# Patient Record
Sex: Male | Born: 1965 | Race: White | Hispanic: No | Marital: Single | Smoking: Never smoker
Health system: Southern US, Community
[De-identification: ages and names within clinical notes are randomized; demographics above are authoritative.]

## PROBLEM LIST (undated history)

## (undated) DIAGNOSIS — K219 Gastro-esophageal reflux disease without esophagitis: Secondary | ICD-10-CM

## (undated) DIAGNOSIS — E119 Type 2 diabetes mellitus without complications: Secondary | ICD-10-CM

## (undated) HISTORY — PX: NO PAST SURGERIES: SHX2092

## (undated) HISTORY — DX: Gastro-esophageal reflux disease without esophagitis: K21.9

## (undated) HISTORY — DX: Type 2 diabetes mellitus without complications: E11.9

---

## 2000-06-20 ENCOUNTER — Encounter: Admission: RE | Admit: 2000-06-20 | Discharge: 2000-06-20 | Payer: Self-pay | Admitting: Internal Medicine

## 2007-07-16 ENCOUNTER — Ambulatory Visit: Payer: Self-pay | Admitting: Hospitalist

## 2007-07-16 ENCOUNTER — Encounter: Payer: Self-pay | Admitting: Internal Medicine

## 2007-07-16 DIAGNOSIS — R634 Abnormal weight loss: Secondary | ICD-10-CM

## 2007-07-16 DIAGNOSIS — K219 Gastro-esophageal reflux disease without esophagitis: Secondary | ICD-10-CM

## 2007-07-16 LAB — CONVERTED CEMR LAB
AST: 22 units/L (ref 0–37)
Albumin: 4.4 g/dL (ref 3.5–5.2)
BUN: 14 mg/dL (ref 6–23)
Basophils Relative: 0 % (ref 0–1)
Calcium: 9.5 mg/dL (ref 8.4–10.5)
Chloride: 106 meq/L (ref 96–112)
Cholesterol: 128 mg/dL (ref 0–200)
Creatinine, Ser: 0.84 mg/dL (ref 0.40–1.50)
Glucose, Bld: 103 mg/dL — ABNORMAL HIGH (ref 70–99)
HDL: 32 mg/dL — ABNORMAL LOW (ref >39)
Hemoglobin: 16.8 g/dL (ref 13.0–17.0)
Lymphs Abs: 2.5 10*3/uL (ref 0.7–4.0)
MCHC: 34.1 g/dL (ref 30.0–36.0)
MCV: 84 fL (ref 78.0–100.0)
Monocytes Absolute: 1 10*3/uL (ref 0.1–1.0)
Monocytes Relative: 11 % (ref 3–12)
Neutro Abs: 5.3 10*3/uL (ref 1.7–7.7)
Potassium: 3.9 meq/L (ref 3.5–5.3)
RBC: 5.87 M/uL — ABNORMAL HIGH (ref 4.22–5.81)
Total CHOL/HDL Ratio: 4
Triglycerides: 153 mg/dL — ABNORMAL HIGH (ref <150)
WBC: 8.8 10*3/uL (ref 4.0–10.5)

## 2007-08-28 ENCOUNTER — Ambulatory Visit: Payer: Self-pay | Admitting: Internal Medicine

## 2007-08-28 ENCOUNTER — Encounter (INDEPENDENT_AMBULATORY_CARE_PROVIDER_SITE_OTHER): Payer: Self-pay | Admitting: Internal Medicine

## 2007-08-28 DIAGNOSIS — J069 Acute upper respiratory infection, unspecified: Secondary | ICD-10-CM | POA: Insufficient documentation

## 2007-08-29 ENCOUNTER — Encounter: Payer: Self-pay | Admitting: Internal Medicine

## 2007-08-29 ENCOUNTER — Telehealth: Payer: Self-pay | Admitting: Internal Medicine

## 2007-12-25 ENCOUNTER — Encounter: Payer: Self-pay | Admitting: Internal Medicine

## 2008-01-03 ENCOUNTER — Encounter: Payer: Self-pay | Admitting: Internal Medicine

## 2008-03-31 ENCOUNTER — Encounter: Payer: Self-pay | Admitting: Internal Medicine

## 2009-07-06 ENCOUNTER — Encounter: Payer: Self-pay | Admitting: *Deleted

## 2009-07-06 ENCOUNTER — Ambulatory Visit: Payer: Self-pay | Admitting: Internal Medicine

## 2009-07-06 LAB — CONVERTED CEMR LAB
ALT: 28 units/L (ref 0–53)
BUN: 11 mg/dL (ref 6–23)
CO2: 26 meq/L (ref 19–32)
Calcium: 9.3 mg/dL (ref 8.4–10.5)
Chloride: 105 meq/L (ref 96–112)
Creatinine, Ser: 0.86 mg/dL (ref 0.40–1.50)
Glucose, Bld: 81 mg/dL (ref 70–99)
HCT: 50.2 % (ref 39.0–52.0)
HDL: 32 mg/dL — ABNORMAL LOW (ref >39)
MCV: 84.1 fL (ref >=78.0)
Platelets: 164 10*3/uL (ref 150–400)
RBC: 5.97 M/uL — ABNORMAL HIGH (ref 4.22–5.81)
Total Bilirubin: 0.5 mg/dL (ref 0.3–1.2)
Total CHOL/HDL Ratio: 3.4
VLDL: 12 mg/dL (ref 0–40)
WBC: 8.9 10*3/uL (ref 4.0–10.5)

## 2009-07-14 ENCOUNTER — Encounter: Payer: Self-pay | Admitting: Internal Medicine

## 2010-01-07 ENCOUNTER — Encounter: Payer: Self-pay | Admitting: Internal Medicine

## 2010-01-08 ENCOUNTER — Emergency Department (HOSPITAL_COMMUNITY): Admission: EM | Admit: 2010-01-08 | Discharge: 2010-01-08 | Payer: Self-pay | Admitting: Family Medicine

## 2010-03-24 ENCOUNTER — Telehealth: Payer: Self-pay | Admitting: Internal Medicine

## 2010-03-26 ENCOUNTER — Ambulatory Visit: Payer: Self-pay | Admitting: Diagnostic Radiology

## 2010-03-26 ENCOUNTER — Emergency Department (HOSPITAL_BASED_OUTPATIENT_CLINIC_OR_DEPARTMENT_OTHER): Admission: EM | Admit: 2010-03-26 | Discharge: 2010-03-26 | Payer: Self-pay | Admitting: Emergency Medicine

## 2010-03-31 ENCOUNTER — Encounter: Payer: Self-pay | Admitting: Internal Medicine

## 2010-04-13 ENCOUNTER — Encounter: Payer: Self-pay | Admitting: Internal Medicine

## 2010-06-21 ENCOUNTER — Ambulatory Visit
Admission: RE | Admit: 2010-06-21 | Discharge: 2010-06-21 | Payer: Self-pay | Source: Home / Self Care | Attending: Internal Medicine | Admitting: Internal Medicine

## 2010-06-21 LAB — CONVERTED CEMR LAB
AST: 19 units/L (ref 0–37)
Albumin: 4.3 g/dL (ref 3.5–5.2)
Alkaline Phosphatase: 63 units/L (ref 39–117)
Basophils Relative: 0 % (ref 0–1)
Calcium: 9 mg/dL (ref 8.4–10.5)
Chloride: 105 meq/L (ref 96–112)
Eosinophils Absolute: 0 10*3/uL (ref 0.0–0.7)
LDL Cholesterol: 62 mg/dL (ref 0–99)
Lymphs Abs: 2.4 10*3/uL (ref 0.7–4.0)
MCHC: 35.3 g/dL (ref 30.0–36.0)
MCV: 83.3 fL (ref 78.0–100.0)
Neutro Abs: 5.1 10*3/uL (ref 1.7–7.7)
Neutrophils Relative %: 61 % (ref 43–77)
Platelets: 179 10*3/uL (ref 150–400)
Potassium: 4.2 meq/L (ref 3.5–5.3)
RBC: 5.64 M/uL (ref 4.22–5.81)
Sodium: 141 meq/L (ref 135–145)
Total Protein: 6.7 g/dL (ref 6.0–8.3)
WBC: 8.4 10*3/uL (ref 4.0–10.5)

## 2010-06-29 NOTE — Miscellaneous (Signed)
  Clinical Lists Changes  Medications: Added new medication of AZITHROMYCIN 250 MG  TABS (AZITHROMYCIN) 2 by  mouth today and then 1 daily for 4 days - Signed Rx of AZITHROMYCIN 250 MG  TABS (AZITHROMYCIN) 2 by  mouth today and then 1 daily for 4 days;  #6 x 0;  Signed;  Entered by: Julaine Fusi  DO;  Authorized by: Julaine Fusi  DO;  Method used: Electronically to [facility]*, [address], [address] Shipping/mailing, [place], [place]  [address], Ph: [phone], Fax: [phone]    Prescriptions: AZITHROMYCIN 250 MG  TABS (AZITHROMYCIN) 2 by  mouth today and then 1 daily for 4 days  #6 x 0   Entered and Authorized by:   Julaine Fusi  DO   Signed by:   Julaine Fusi  DO on 04/13/2010   Method used:   Electronically to        [facility]* (retail)       [address]       [address] Shipping/mailing       [place], [place]  [address]       Ph: [phone]       Fax: [phone]   RxID:   [id number]

## 2010-06-29 NOTE — Letter (Signed)
Summary: Work Excuse  [facility]  [address]   [place], [place] [address]   Phone: [phone]  Fax: [phone]    Today's Date: January 07, 2010  Name of Patient: Derek Davidson  The above named patient had a medical visit today at:  am / pm.  Please take this into consideration when reviewing the time away from work/school.    Special Instructions:  [  ] None  [  ] To be off the remainder of today, returning to the normal work / school schedule tomorrow.  [  ] To be off until the next scheduled appointment on ______________________.  [  X] Other ___Off work 8/10-8/12_______________________________ ________________________________________________________________________   Sincerely yours,   Julaine Fusi  DO

## 2010-06-29 NOTE — Progress Notes (Signed)
Summary: refill/gg  Phone Note Refill Request  on March 24, 2010 3:10 PM  Refills Requested: Medication #1:  OMEPRAZOLE 40 MG  CPDR Take 1 tablet by mouth once a day. Pt called and asked for Rx for nexium 20 mg, it works better for him.  He states he talked to you about the change   Method Requested: Electronic Initial call taken by: Merrie Roof RN,  March 24, 2010 3:12 PM Initial call taken by: Merrie Roof RN,  March 24, 2010 3:10 PM  Follow-up for Phone Call        If I am looking through records correctly, he has not been seen since 07/2007.  Will not refill if that is the case. Follow-up by: Mariea Stable MD,  March 26, 2010 12:14 PM  Additional Follow-up for Phone Call Additional follow up Details #1::        Flag sent to [facility]  to schedule appointment. Additional Follow-up by: Merrie Roof RN,  March 31, 2010 2:51 PM

## 2010-06-29 NOTE — Letter (Signed)
Summary: Out of Work  [facility]  [address]   [place], [place] [address]   Phone: [phone]  Fax: [phone]    July 14, 2009   Employee:  RAYLIN WINER    To Whom It May Concern:   For Medical reasons, please excuse the above named employee from work for the following dates:  Start:    End:    If you need additional information, please feel free to contact our office.         Sincerely,    Julaine Fusi  DO

## 2010-06-29 NOTE — Letter (Signed)
Summary: Out of Work  [facility]  [address]   [place], [place] [address]   Phone: [phone]  Fax: [phone]    July 14, 2009   Employee:  TINSLEY LOMAS    To Whom It May Concern:   For Medical reasons, please excuse the above named employee from work for the following dates:  Start:   07/14/2009  End:   07/15/2009  If you need additional information, please feel free to contact our office.         Sincerely,    Edsel Petrin, DO

## 2010-06-29 NOTE — Miscellaneous (Signed)
  Clinical Lists Changes  Medications: Removed medication of ACIPHEX 20 MG  TBEC (RABEPRAZOLE SODIUM) Take 1 tablet by mouth once a day as needed Changed medication from OMEPRAZOLE 40 MG  CPDR (OMEPRAZOLE) Take 1 tablet by mouth once a day to NEXIUM 40 MG PACK (ESOMEPRAZOLE MAGNESIUM) Take 1 tablet by mouth once a day - Signed Rx of NEXIUM 40 MG PACK (ESOMEPRAZOLE MAGNESIUM) Take 1 tablet by mouth once a day;  #90 x 3;  Signed;  Entered by: Julaine Fusi  DO;  Authorized by: Julaine Fusi  DO;  Method used: Electronically to [facility]*, [address], [address] Shipping/mailing, [place], [place]  [address], Ph: [phone], Fax: [phone]    Prescriptions: NEXIUM 40 MG PACK (ESOMEPRAZOLE MAGNESIUM) Take 1 tablet by mouth once a day  #90 x 3   Entered and Authorized by:   Julaine Fusi  DO   Signed by:   Julaine Fusi  DO on 03/31/2010   Method used:   Electronically to        [facility]* (retail)       [address]       [address] Shipping/mailing       [place], [place]  [address]       Ph: [phone]       Fax: [phone]   RxID:   [id number]

## 2010-07-07 NOTE — Assessment & Plan Note (Signed)
Summary: TO SEE DR Phillips Odor at 1:15pm/cfb   Vital Signs:  Patient profile:   45 year old male Height:      71 inches (180.34 cm) Weight:      216.7 pounds (102.73 kg) BMI:     31.63 Temp:     97.7 degrees F (36.50 degrees C) oral Pulse rate:   79 / minute BP sitting:   124 / 75  (right arm) Cuff size:   regular  Vitals Entered By: Theotis Barrio NT II (June 21, 2010 1:25 PM) CC: PATIENT STATES HE IS HERE FOR PHYSICAL  / , Preventive Care Is Patient Diabetic? No Pain Assessment Patient in pain? no       Have you ever been in a relationship where you felt threatened, hurt or afraid?No   Does patient need assistance? Functional Status Self care Ambulation Normal   CC:  PATIENT STATES HE IS HERE FOR PHYSICAL  /  and Preventive Care.  History of Present Illness: Derek Davidson is in for a regular check up. No complaints or problems. Needs med refills on his reflux medicine. Has been on a weight loss plan doing well.  Preventive Screening-Counseling & Management  Alcohol-Tobacco     Smoking Status: never  Allergies: 1)  ! Hydrocodone  Physical Exam  General:  alert.   Lungs:  normal breath sounds, no crackles, and no wheezes.   Heart:  normal rate, regular rhythm, no murmur, no gallop, and no rub.   Abdomen:  soft, non-tender, normal bowel sounds, no distention, and no masses.   Msk:  No deformity or scoliosis noted of thoracic or lumbar spine.   Extremities:  no edema Neurologic:  alert & oriented X3.   Skin:  Intact without suspicious lesions or rashes Psych:  Cognition and judgment appear intact. Alert and cooperative with normal attention span and concentration. No apparent delusions, illusions, hallucinations   Impression & Recommendations:  Problem # 1:  PREVENTIVE HEALTH CARE (ICD-V70.0) Due for lipid panel today. Orders: T-Lipid Profile ([id number])  Problem # 2:  GERD (ICD-530.81) Refilled his Nexium. Has never had endoscopy. Will get colonoscopy and  consider endoscopy for long term GERD when he turns 49. Otehrwise no change. His updated medication list for this problem includes:    Nexium 40 Mg Pack (Esomeprazole magnesium) .Marland Kitchen... Take 1 tablet by mouth once a day  Orders: T-Comprehensive Metabolic Panel ([id number]) T-CBC w/Diff ([id number])  Complete Medication List: 1)  Nexium 40 Mg Pack (Esomeprazole magnesium) .... Take 1 tablet by mouth once a day  Patient Instructions: 1)  Please schedule a follow-up appointment as needed.   Orders Added: 1)  Est. Patient Level III [52841] 2)  T-Comprehensive Metabolic Panel [[id number]] 3)  T-Lipid Profile [[id number]] 4)  T-CBC w/Diff [[id number]]   Process Orders Check Orders Results:     [facility]: ABN not required for this insurance Tests Sent for requisitioning (June 28, 2010 2:05 PM):     06/21/2010: [facility] -- T-Comprehensive Metabolic Panel [[id number]] (signed)     06/21/2010: [facility] -- T-Lipid Profile [phone] (signed)     06/21/2010: [facility] -- T-CBC w/Diff [[id number]] (signed)     Prevention & Chronic Care Immunizations   Influenza vaccine: Not documented    Tetanus booster: Not documented    Pneumococcal vaccine: Not documented  Other Screening   Smoking status: never  (06/21/2010)  Lipids   Total Cholesterol: 110  (07/06/2009)   LDL: 66  (07/06/2009)   LDL Direct:  Not documented   HDL: 32  (07/06/2009)   Triglycerides: 62  (07/06/2009)    Process Orders Check Orders Results:     [facility]: ABN not required for this insurance Tests Sent for requisitioning (June 28, 2010 2:05 PM):     06/21/2010: [facility] -- T-Comprehensive Metabolic Panel [[id number]] (signed)     06/21/2010: [facility] -- T-Lipid Profile [phone] (signed)     06/21/2010: [facility] -- [facility] w/Diff  [[id number]] (signed)

## 2010-08-13 LAB — POCT I-STAT, CHEM 8
BUN: 8 mg/dL (ref 6–23)
Chloride: 102 mEq/L (ref 96–112)
Creatinine, Ser: 1 mg/dL (ref 0.4–1.5)
Hemoglobin: 17 g/dL (ref 13.0–17.0)
Potassium: 3.6 mEq/L (ref 3.5–5.1)
Sodium: 139 mEq/L (ref 135–145)

## 2010-08-13 LAB — POCT URINALYSIS DIPSTICK
Glucose, UA: NEGATIVE mg/dL
Ketones, ur: NEGATIVE mg/dL
Specific Gravity, Urine: 1.025 (ref 1.005–1.030)

## 2010-10-26 ENCOUNTER — Encounter: Payer: Self-pay | Admitting: Internal Medicine

## 2010-12-10 ENCOUNTER — Encounter: Payer: Self-pay | Admitting: Internal Medicine

## 2011-05-10 ENCOUNTER — Other Ambulatory Visit: Payer: Self-pay | Admitting: *Deleted

## 2011-05-10 NOTE — Telephone Encounter (Signed)
Pt last seen 1/12 He will call and make appointment for routine physical.

## 2011-05-11 MED ORDER — ESOMEPRAZOLE MAGNESIUM 40 MG PO PACK: 40.0000 mg | PACK | Freq: Every day | ORAL | Status: DC

## 2012-01-27 ENCOUNTER — Encounter: Payer: Self-pay | Admitting: Internal Medicine

## 2012-01-27 ENCOUNTER — Ambulatory Visit (INDEPENDENT_AMBULATORY_CARE_PROVIDER_SITE_OTHER): Payer: BC Managed Care – PPO | Admitting: Internal Medicine

## 2012-01-27 VITALS — BP 120/80 | HR 97 | Temp 97.2°F | Ht 70.0 in | Wt 226.1 lb

## 2012-01-27 DIAGNOSIS — K219 Gastro-esophageal reflux disease without esophagitis: Secondary | ICD-10-CM

## 2012-01-27 DIAGNOSIS — Z136 Encounter for screening for cardiovascular disorders: Secondary | ICD-10-CM

## 2012-01-27 DIAGNOSIS — L918 Other hypertrophic disorders of the skin: Secondary | ICD-10-CM

## 2012-01-27 DIAGNOSIS — L919 Hypertrophic disorder of the skin, unspecified: Secondary | ICD-10-CM

## 2012-01-27 LAB — COMPREHENSIVE METABOLIC PANEL
ALT: 65 U/L — ABNORMAL HIGH (ref 0–53)
CO2: 25 mEq/L (ref 19–32)
Creat: 0.98 mg/dL (ref 0.50–1.35)
Total Bilirubin: 0.8 mg/dL (ref 0.3–1.2)

## 2012-01-27 LAB — LIPID PANEL
HDL: 33 mg/dL — ABNORMAL LOW (ref >39)
LDL Cholesterol: 48 mg/dL (ref 0–99)
Total CHOL/HDL Ratio: 3.1 Ratio
Triglycerides: 109 mg/dL (ref <150)

## 2012-01-27 MED ORDER — ESOMEPRAZOLE MAGNESIUM 40 MG PO CPDR: 40.0000 mg | DELAYED_RELEASE_CAPSULE | Freq: Every day | ORAL | Status: DC
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
## 2012-01-27 NOTE — Patient Instructions (Signed)
1.  Hold off putting deodorant on the armpits for 2 days to let the area heal  2. Shower with the band aid on then change them daily after the shower.  3.  Start an Aspirin 81 mg.  1 tablet daily for your heart.  4.  Stop in the lab to have your blood drawn.  If we need to discuss the results I will call you.  5. Follow up in 6-12 months.

## 2012-02-13 ENCOUNTER — Other Ambulatory Visit: Payer: Self-pay | Admitting: *Deleted

## 2012-02-13 DIAGNOSIS — K219 Gastro-esophageal reflux disease without esophagitis: Secondary | ICD-10-CM

## 2012-02-14 MED ORDER — ESOMEPRAZOLE MAGNESIUM 40 MG PO CPDR: 40.0000 mg | DELAYED_RELEASE_CAPSULE | Freq: Every day | ORAL | Status: DC

## 2012-02-14 NOTE — Telephone Encounter (Signed)
I filled this for 30 capsules with one refill at his visit on 8/30 which should be for a 30 day supply.  Can you call and see why he is asking for a refill already?

## 2012-02-14 NOTE — Telephone Encounter (Signed)
i have called and he has a new pharm now, old pharm states he didn't have any refills left, that there was only 1 on the last one

## 2012-03-22 DIAGNOSIS — L918 Other hypertrophic disorders of the skin: Secondary | ICD-10-CM | POA: Insufficient documentation

## 2012-03-25 NOTE — Assessment & Plan Note (Signed)
Patient has a history of GERD symptoms and uses Nexium every few days.  We will refill the medication for him today.

## 2012-03-25 NOTE — Assessment & Plan Note (Addendum)
Lab Results  Component Value Date   CHOL 103 01/27/2012   CHOL 108 06/21/2010   CHOL 110 07/06/2009   Lab Results  Component Value Date   HDL 33* 01/27/2012   HDL 34* 06/21/2010   HDL 32* 07/06/2009   Lab Results  Component Value Date   LDLCALC 48 01/27/2012   LDLCALC 62 06/21/2010   LDLCALC 66 07/06/2009   Lab Results  Component Value Date   TRIG 109 01/27/2012   TRIG 59 06/21/2010   TRIG 62 07/06/2009   Lab Results  Component Value Date   CHOLHDL 3.1 01/27/2012   CHOLHDL 3.2 Ratio 06/21/2010   CHOLHDL 3.4 Ratio 07/06/2009   No results found for this basename: LDLDIRECT   Comprehensive Metabolic Panel:    Component Value Date/Time   NA 142 01/27/2012 1632   K 4.2 01/27/2012 1632   CL 107 01/27/2012 1632   CO2 25 01/27/2012 1632   BUN 13 01/27/2012 1632   CREATININE 0.98 01/27/2012 1632   CREATININE 0.87 06/21/2010 2030   GLUCOSE 87 01/27/2012 1632   CALCIUM 9.2 01/27/2012 1632   AST 35 01/27/2012 1632   ALT 65* 01/27/2012 1632   ALKPHOS 83 01/27/2012 1632   BILITOT 0.8 01/27/2012 1632   PROT 7.0 01/27/2012 1632   ALBUMIN 4.7 01/27/2012 1632   LDL goal of <130 with his risk factors and he is well below that.  We will continue to monitor every 3-5 years.  His fasting glucose today is 87 so he does not have diabetes.  There is a mild increase in ALT that is isolated.  We will continue to monitor.  We will have him start a daily ASA for primary prevention.

## 2012-03-25 NOTE — Assessment & Plan Note (Signed)
Derek Davidson has several skin tags in his bilateral axilla.  There are only 3 that have sub 1 mm bases.  He would like them removed because they become irritated when he moves his arms.    Risks and benefits were discussed including bleeding, infection, and pain.  Benefits including not irritating and decreased pain.  Patient agreed to proceed.  Consent was signed.    3 skin tags, two left and 1 right, with sub 1 mm bases removed with sterile scissors after iodine scrub.   Minimal blood loss was stopped with silver nitrate sticks.  Patient tolerated the procedure well.

## 2014-01-02 ENCOUNTER — Encounter (HOSPITAL_COMMUNITY): Payer: Self-pay | Admitting: Emergency Medicine

## 2014-01-02 ENCOUNTER — Emergency Department (HOSPITAL_COMMUNITY)
Admission: EM | Admit: 2014-01-02 | Discharge: 2014-01-02 | Disposition: A | Discharge status: Home / Self Care | Payer: BC Managed Care – PPO | Source: Home / Self Care | Attending: Family Medicine | Admitting: Family Medicine

## 2014-01-02 DIAGNOSIS — A09 Infectious gastroenteritis and colitis, unspecified: Secondary | ICD-10-CM

## 2014-01-02 MED ORDER — CIPROFLOXACIN HCL 500 MG PO TABS: 500.0000 mg | ORAL_TABLET | Freq: Two times a day (BID) | ORAL | Status: DC

## 2014-01-02 NOTE — ED Notes (Signed)
Reports abdominal cramping with nausea.  On set yesterday evening.  Took otc alka-seltzer stomach med with mild relief.  Work this a.m with diarrhea and more intense abdominal cramping.  Denies fever and vomiting.
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
## 2014-01-02 NOTE — Discharge Instructions (Signed)
Thank you for coming in today. Take Cipro twice daily for 3 days.  Use Imodium as needed. Do not use if you have severe pain or blood in the stool Come back as needed If your belly pain worsens, or you have high fever, bad vomiting, blood in your stool or black tarry stool go to the Emergency Room.    Diarrhea Diarrhea is frequent loose and watery bowel movements. It can cause you to feel weak and dehydrated. Dehydration can cause you to become tired and thirsty, have a dry mouth, and have decreased urination that often is dark yellow. Diarrhea is a sign of another problem, most often an infection that will not last long. In most cases, diarrhea typically lasts 2-3 days. However, it can last longer if it is a sign of something more serious. It is important to treat your diarrhea as directed by your caregiver to lessen or prevent future episodes of diarrhea. CAUSES  Some common causes include:  Gastrointestinal infections caused by viruses, bacteria, or parasites.  Food poisoning or food allergies.  Certain medicines, such as antibiotics, chemotherapy, and laxatives.  Artificial sweeteners and fructose.  Digestive disorders. HOME CARE INSTRUCTIONS  Ensure adequate fluid intake (hydration): Have 1 cup (8 oz) of fluid for each diarrhea episode. Avoid fluids that contain simple sugars or sports drinks, fruit juices, whole milk products, and sodas. Your urine should be clear or pale yellow if you are drinking enough fluids. Hydrate with an oral rehydration solution that you can purchase at pharmacies, retail stores, and online. You can prepare an oral rehydration solution at home by mixing the following ingredients together:   - tsp table salt.   tsp baking soda.   tsp salt substitute containing potassium chloride.  1  tablespoons sugar.  1 L (34 oz) of water.  Certain foods and beverages may increase the speed at which food moves through the gastrointestinal (GI) tract. These foods  and beverages should be avoided and include:  Caffeinated and alcoholic beverages.  High-fiber foods, such as raw fruits and vegetables, nuts, seeds, and whole grain breads and cereals.  Foods and beverages sweetened with sugar alcohols, such as xylitol, sorbitol, and mannitol.  Some foods may be well tolerated and may help thicken stool including:  Starchy foods, such as rice, toast, pasta, low-sugar cereal, oatmeal, grits, baked potatoes, crackers, and bagels.  Bananas.  Applesauce.  Add probiotic-rich foods to help increase healthy bacteria in the GI tract, such as yogurt and fermented milk products.  Wash your hands well after each diarrhea episode.  Only take over-the-counter or prescription medicines as directed by your caregiver.  Take a warm bath to relieve any burning or pain from frequent diarrhea episodes. SEEK IMMEDIATE MEDICAL CARE IF:   You are unable to keep fluids down.  You have persistent vomiting.  You have blood in your stool, or your stools are black and tarry.  You do not urinate in 6-8 hours, or there is only a small amount of very dark urine.  You have abdominal pain that increases or localizes.  You have weakness, dizziness, confusion, or light-headedness.  You have a severe headache.  Your diarrhea gets worse or does not get better.  You have a fever or persistent symptoms for more than 2-3 days.  You have a fever and your symptoms suddenly get worse. MAKE SURE YOU:   Understand these instructions.  Will watch your condition.  Will get help right away if you are not doing well or  get worse. Document Released: 05/06/2002 Document Revised: 09/30/2013 Document Reviewed: 01/22/2012 [facility] Patient Information 2015 [place], [place]. This information is not intended to replace advice given to you by your health care provider. Make sure you discuss any questions you have with your health care provider.
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
## 2016-05-03 ENCOUNTER — Telehealth: Payer: Self-pay | Admitting: Internal Medicine

## 2016-05-03 NOTE — Telephone Encounter (Signed)
APT. REMINDER CALL, LMTCB °

## 2016-05-04 ENCOUNTER — Encounter (INDEPENDENT_AMBULATORY_CARE_PROVIDER_SITE_OTHER): Payer: Self-pay

## 2016-05-04 ENCOUNTER — Encounter: Payer: Self-pay | Admitting: Internal Medicine

## 2016-05-04 ENCOUNTER — Ambulatory Visit (INDEPENDENT_AMBULATORY_CARE_PROVIDER_SITE_OTHER): Payer: No Typology Code available for payment source | Admitting: Internal Medicine

## 2016-05-04 VITALS — BP 128/95 | HR 94 | Temp 97.6°F | Ht 69.0 in | Wt 197.6 lb

## 2016-05-04 DIAGNOSIS — K219 Gastro-esophageal reflux disease without esophagitis: Secondary | ICD-10-CM

## 2016-05-04 DIAGNOSIS — Z0000 Encounter for general adult medical examination without abnormal findings: Secondary | ICD-10-CM

## 2016-05-04 DIAGNOSIS — Z79899 Other long term (current) drug therapy: Secondary | ICD-10-CM

## 2016-05-04 DIAGNOSIS — Z833 Family history of diabetes mellitus: Secondary | ICD-10-CM | POA: Diagnosis not present

## 2016-05-04 DIAGNOSIS — R634 Abnormal weight loss: Secondary | ICD-10-CM

## 2016-05-04 DIAGNOSIS — Z800 Family history of malignant neoplasm of digestive organs: Secondary | ICD-10-CM

## 2016-05-04 LAB — GLUCOSE, CAPILLARY: GLUCOSE-CAPILLARY: 332 mg/dL — AB (ref 65–99)

## 2016-05-04 LAB — POCT GLYCOSYLATED HEMOGLOBIN (HGB A1C): Hemoglobin A1C: >14

## 2016-05-04 MED ORDER — METFORMIN HCL 500 MG PO TABS: ORAL_TABLET | ORAL | 1 refills | Status: DC

## 2016-05-04 NOTE — Patient Instructions (Signed)
[name],  It was a pleasure to meet you today. I am going to check some blood work for you today. The results will come back in the next 1-2 days. I will give you a call with the results.  I would also like to get you set up to be seen by the stomach doctors who do colonoscopies to have that done. They will call you for an appointment.  I would like to see you back in 3 months for follow up or sooner if you have any problems.

## 2016-05-05 ENCOUNTER — Encounter: Payer: Self-pay | Admitting: Gastroenterology

## 2016-05-05 LAB — CMP14 + ANION GAP
ALBUMIN: 4.3 g/dL (ref 3.5–5.5)
ALK PHOS: 115 IU/L (ref 39–117)
ALT: 22 IU/L (ref 0–44)
AST: 17 IU/L (ref 0–40)
Albumin/Globulin Ratio: 1.7 (ref 1.2–2.2)
Anion Gap: 20 mmol/L — ABNORMAL HIGH (ref 10.0–18.0)
BILIRUBIN TOTAL: 0.5 mg/dL (ref 0.0–1.2)
BUN / CREAT RATIO: 15 (ref 9–20)
BUN: 12 mg/dL (ref 6–24)
CHLORIDE: 93 mmol/L — AB (ref 96–106)
CO2: 24 mmol/L (ref 18–29)
Calcium: 8.9 mg/dL (ref 8.7–10.2)
Creatinine, Ser: 0.78 mg/dL (ref 0.76–1.27)
GFR calc Af Amer: 121 mL/min/{1.73_m2} (ref >59)
GFR calc non Af Amer: 105 mL/min/{1.73_m2} (ref >59)
GLOBULIN, TOTAL: 2.5 g/dL (ref 1.5–4.5)
Glucose: 343 mg/dL — ABNORMAL HIGH (ref 65–99)
Potassium: 4 mmol/L (ref 3.5–5.2)
SODIUM: 137 mmol/L (ref 134–144)
Total Protein: 6.8 g/dL (ref 6.0–8.5)

## 2016-05-05 LAB — CBC WITH DIFFERENTIAL/PLATELET
BASOS ABS: 0 10*3/uL (ref 0.0–0.2)
Basos: 0 %
EOS (ABSOLUTE): 0 10*3/uL (ref 0.0–0.4)
Eos: 0 %
Hematocrit: 46.7 % (ref 37.5–51.0)
Hemoglobin: 16.6 g/dL (ref 13.0–17.7)
Immature Grans (Abs): 0 10*3/uL (ref 0.0–0.1)
Immature Granulocytes: 0 %
LYMPHS ABS: 2.9 10*3/uL (ref 0.7–3.1)
Lymphs: 31 %
MCH: 29.2 pg (ref 26.6–33.0)
MCHC: 35.5 g/dL (ref 31.5–35.7)
MCV: 82 fL (ref 79–97)
MONOS ABS: 0.9 10*3/uL (ref 0.1–0.9)
Monocytes: 9 %
NEUTROS ABS: 5.7 10*3/uL (ref 1.4–7.0)
Neutrophils: 60 %
PLATELETS: 186 10*3/uL (ref 150–379)
RBC: 5.68 x10E6/uL (ref 4.14–5.80)
RDW: 13.1 % (ref 12.3–15.4)
WBC: 9.5 10*3/uL (ref 3.4–10.8)

## 2016-05-05 LAB — TSH: TSH: 2.97 u[IU]/mL (ref 0.450–4.500)

## 2016-05-06 DIAGNOSIS — R634 Abnormal weight loss: Secondary | ICD-10-CM | POA: Insufficient documentation

## 2016-05-06 DIAGNOSIS — Z0000 Encounter for general adult medical examination without abnormal findings: Secondary | ICD-10-CM | POA: Insufficient documentation

## 2016-05-06 MED ORDER — INSULIN DETEMIR 100 UNIT/ML ~~LOC~~ SOLN: 0.1000 [IU]/kg | Freq: Every day | SUBCUTANEOUS | 1 refills | Status: DC

## 2016-05-06 MED ORDER — GLUCOSE BLOOD VI STRP: ORAL_STRIP | 12 refills | Status: DC

## 2016-05-06 MED ORDER — ASSURE PRO BLOOD GLUCOSE METER DEVI: 1.0000 "application " | Freq: Four times a day (QID) | 0 refills | Status: DC

## 2016-05-06 NOTE — Progress Notes (Signed)
Medicine attending: Medical history, presenting problems, physical findings, and medications, reviewed with resident physician Dr Nathan Boswell on the day of the patient visit and I concur with his evaluation and management plan. 

## 2016-05-06 NOTE — Assessment & Plan Note (Signed)
Due for colonoscopy. Referral made.  Needs flu vaccine at next visit.

## 2016-05-06 NOTE — Assessment & Plan Note (Signed)
Takes Nexium. Takes 1 tablet a week. No other complaints.   Continue current medications.

## 2016-05-06 NOTE — Assessment & Plan Note (Addendum)
In the last 8 months, has lost approximately 30 lbs. Unintentional. Has noticed losing muscle mass. No diet change. Reports no change in appetite. Reports polyuria and polydipsia. Does note that he has cut soft drinks out of his diet. No cough, no night sweats, no fevers/chills. Having some abdominal pain and diarrhea 1-2 times a month. Says this occurs whenever he visits his sister in [place], [place] and drinking the water or ice there. No nausea/vomiting. No fatigue. No hematuria. Normal bowel movements. Was previously going twice a day and now only going once a day. No melena or hematochezia. No pencil thin stools.  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 Never smoker. Does not drink ETOH. No drug use.  Only family history of CA was colon cancer in his maternal grandmother at age 51-70. Does have a family history of diabetes type 2 in his father and his sister is pre-diabetic.  Reports having a colonoscopy 3 years ago here in town.  Weight was 226 12/2010 visit. Weight is 197 lbs here today.   Plan Check A1c, CMET, CBC and TSH. Will refer for colonoscopy as cannot find records of his previous. Concerns for diabetes vs malignancy but nothing focal on exam to point to any particular etiology.  ADDENEDUM 05/05/16 1725: A1c returned > 14 with CBG 332. BMET pending. Attempted to call patient but went to voicemail. Will attempt to contact patient again. CBC, TSH wnl.   05/06/16: Attempt to call patient. BMET returned with AG of 20 (primarily due to low Chloride). He reported good PO intake with no N/V/D at our visit. Would like patient to return for further evaluation.   05/07/16: Able to contact the patient this morning and go over his results. Sent in Rx for Metformin and Levemir as well as testing supplies. Discussed the dosing and checking his blood sugars. Marland Kitchen. He reports he is tolerating PO intake and has no complaints. Do not suspect he is in DKA that would require hospitalization. Will arrange close  follow up next week. Check UA and microalbumin at that visit.

## 2016-05-31 ENCOUNTER — Other Ambulatory Visit: Payer: Self-pay | Admitting: Pharmacist

## 2016-05-31 DIAGNOSIS — Z136 Encounter for screening for cardiovascular disorders: Secondary | ICD-10-CM

## 2016-05-31 MED ORDER — INSULIN GLARGINE 100 UNIT/ML SOLOSTAR PEN: 9.0000 [IU] | PEN_INJECTOR | Freq: Every day | SUBCUTANEOUS | 11 refills | Status: DC

## 2016-06-01 NOTE — Progress Notes (Unsigned)
Patient states levemir is too costly. Derek Davidson researched patient formulary and found the following: [facility] value formulary- levemir, basiliglar, novolog, novolog mix 70/30, OTC novolin R, N, 70/30  Contacted pharmacy to inquire about insulin price for patient (levemir on formulary but costly). Pharmacy was closed. Will try back later today.

## 2016-06-02 ENCOUNTER — Telehealth: Payer: Self-pay | Admitting: Pharmacist

## 2016-06-02 DIAGNOSIS — Z136 Encounter for screening for cardiovascular disorders: Secondary | ICD-10-CM

## 2016-06-02 MED ORDER — INSULIN GLARGINE 100 UNITS/ML SOLOSTAR PEN: 9.0000 [IU] | PEN_INJECTOR | Freq: Every day | SUBCUTANEOUS | 11 refills | Status: DC

## 2016-06-02 MED ORDER — INSULIN PEN NEEDLE 31G X 5 MM MISC: 1.0000 "application " | Freq: Every day | 11 refills | Status: DC
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
## 2016-06-10 LAB — HM DIABETES EYE EXAM

## 2016-06-15 ENCOUNTER — Encounter: Payer: No Typology Code available for payment source | Admitting: Dietician

## 2016-06-17 ENCOUNTER — Ambulatory Visit (INDEPENDENT_AMBULATORY_CARE_PROVIDER_SITE_OTHER): Payer: No Typology Code available for payment source | Admitting: Dietician

## 2016-06-17 ENCOUNTER — Ambulatory Visit (INDEPENDENT_AMBULATORY_CARE_PROVIDER_SITE_OTHER): Payer: No Typology Code available for payment source | Admitting: Internal Medicine

## 2016-06-17 ENCOUNTER — Encounter: Payer: Self-pay | Admitting: Internal Medicine

## 2016-06-17 ENCOUNTER — Other Ambulatory Visit: Payer: Self-pay | Admitting: Dietician

## 2016-06-17 DIAGNOSIS — E118 Type 2 diabetes mellitus with unspecified complications: Secondary | ICD-10-CM

## 2016-06-17 DIAGNOSIS — E119 Type 2 diabetes mellitus without complications: Secondary | ICD-10-CM

## 2016-06-17 DIAGNOSIS — Z713 Dietary counseling and surveillance: Secondary | ICD-10-CM

## 2016-06-17 DIAGNOSIS — Z0000 Encounter for general adult medical examination without abnormal findings: Secondary | ICD-10-CM

## 2016-06-17 LAB — GLUCOSE, CAPILLARY: Glucose-Capillary: 116 mg/dL — ABNORMAL HIGH (ref 65–99)

## 2016-06-17 MED ORDER — GLUCOSE BLOOD VI STRP: ORAL_STRIP | 12 refills | Status: DC

## 2016-06-17 MED ORDER — ONETOUCH VERIO FLEX SYSTEM W/DEVICE KIT: 1.0000 | PACK | Freq: Two times a day (BID) | 0 refills | Status: DC

## 2016-06-17 MED ORDER — ONETOUCH DELICA LANCETS FINE MISC: 12 refills | Status: DC

## 2016-06-17 NOTE — Progress Notes (Signed)
Request a referral for diabetes self management education and support.

## 2016-06-17 NOTE — Progress Notes (Signed)
Diabetes Self-Management Education  Visit Type: (P) First/Initial  Appt. Start Time: 1330 Appt. End Time: 1400  06/17/2016  [name], identified by name and date of birth, is a 51 y.o. male with a diagnosis of Diabetes: (P) Type 2.   ASSESSMENT  There were no vitals taken for this visit. There is no height or weight on file to calculate BMI.      Diabetes Self-Management Education - 06/17/16 1500      Visit Information   Visit Type (P)  First/Initial     Initial Visit   Diabetes Type (P)  Type 2   Are you currently following a meal plan? (P)  Yes   What type of meal plan do you follow? (P)  lower carb   Are you taking your medications as prescribed? (P)  Yes   Date Diagnosed (P)  04/2016     Psychosocial Assessment   Patient Belief/Attitude about Diabetes (P)  Motivated to manage diabetes   Self-care barriers (P)  None   Self-management support (P)  Doctor's office;Family;CDE visits   Patient Concerns (P)  Nutrition/Meal planning;Monitoring   Special Needs (P)  None   Preferred Learning Style (P)  No preference indicated   Learning Readiness (P)  Change in progress   How often do you need to have someone help you when you read instructions, pamphlets, or other written materials from your doctor or pharmacy? (P)  1 - Never     Pre-Education Assessment   Patient understands the diabetes disease and treatment process. (P)  Demonstrates understanding / competency   Patient understands incorporating nutritional management into lifestyle. (P)  Needs Instruction   Patient undertands incorporating physical activity into lifestyle. (P)  Demonstrates understanding / competency   Patient understands using medications safely. (P)  Demonstrates understanding / competency   Patient understands monitoring blood glucose, interpreting and using results (P)  Needs Instruction   Patient understands prevention, detection, and treatment of acute complications. (P)  Demonstrates  understanding / competency   Patient understands prevention, detection, and treatment of chronic complications. (P)  Demonstrates understanding / competency   Patient understands how to develop strategies to address psychosocial issues. (P)  Demonstrates understanding / competency   Patient understands how to develop strategies to promote health/change behavior. (P)  Demonstrates understanding / competency     Complications   Last HgB A1C per patient/outside source (P)  14 %   How often do you check your blood sugar? (P)  0 times/day (not testing)   Fasting Blood glucose range (mg/dL) (P)  [phone]   Number of hypoglycemic episodes per month (P)  --  0   Number of hyperglycemic episodes per week (P)  0   Have you had a dilated eye exam in the past 12 months? (P)  Yes   Have you had a dental exam in the past 12 months? (P)  No   Are you checking your feet? (P)  Yes      Individualized Plan for Diabetes Self-Management Training:   Learning Objective:  Patient will have a greater understanding of diabetes self-management. Patient education plan is to attend individual and/or group sessions per assessed needs and concerns.   My plan to support myself in continuing these changes to care for my diabetes is to attend or contact:   Diabetes Support Groups  Type 2 diabetes support group : 2nd Monday of every month from 6-7 PM at [address] [facility] conference room [phone]  Journals ? Diabetes Forecast- [phone]- DirectoryTags.si ? Diabetes Self-Management- [phone]- www.diabetesselfmanagement.com  Apps ? Calorie Brooke Dare ? Glucose Buddy (Free, tracks blood glucose, graphs)    Let your educator know if you need help accessing the websites.Plan:   There are no Patient Instructions on file for this visit.  Expected Outcomes:     Education material provided: Living Well with Diabetes  If problems or questions, patient to contact team via:   Phone  Future DSME appointment:

## 2016-06-17 NOTE — Assessment & Plan Note (Signed)
Appointment with GI 06/27/16.  Declined Flu shot today. Declined PNA vaccine today.  Will need HIV screening at next blood draw.

## 2016-06-17 NOTE — Progress Notes (Signed)
Medicine attending: Medical history, presenting problems, physical findings, and medications, reviewed with resident physician Dr Valentino NoseNathan Boswell on the day of the patient visit and I concur with his evaluation and management plan. Newly dxd type 2 diabetic. Hb A1c 14%. Started on metformin and levimir. Rapid control of sugars. We anticipate he may be able to come off insulin in near future.
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
## 2016-06-17 NOTE — Assessment & Plan Note (Signed)
Lab Results  Component Value Date   HGBA1C >14.0 05/04/2016     Assessment: [name] was seen in clinic on 05/04/16 with complaints of weight loss (unintentional), polyuria and polydipsia. He was noted to have an A1c > 14 with CBG of 332 on arrival to clinic. BMET, CBC and TSH were all unremarkable at that time. He was started on Levemir 9 units qhs and metformin with instructions to titrate to 1000 mg bid. He was suppose to follow up in 1-2 weeks and is just now following up today.   Today, he reports that he has been doing well since his last visit. He has not been checking his blood sugars at home. Reports he has been unable to get the meter as his insurance did not cover the meter sent in for him. Was unable to get the Levemir for unclear reasons at first. Says that when he went back to the pharmacy they were able to get it to him for no change. He has been on the 9 units for the past 2-3 weeks. Unfortunately has been unable to check his CBGs at home. His CBG today is 116. Denies any symptoms of hypoglycemia. No shaking, excess fatigue, nausea, vomiting, diaphoresis, vision changes, dizziness. Has  been tolerating the metformin with some adverse side effects and has been taking 1000 mg bid. Reports only 2-3 episodes of mild diarrhea in the past month and frequent flatulence but otherwise tolerating well. Says that it has not been significant enough that he is unable to tolerate it. Denies any polyuria or polydipsia. Working very hard on diet and exercise. Would like to meet with Derek Davidson for more information. Has cut out soda/sweet tea/kool aid. Walking daily.   Plan: Will continue current medications today.  Will get set up for a meter at home. Discussed the signs/symptoms of hypoglycemia with him today and what to do if this occurred.  RTC in 2 months for follow up  Meeting with Derek Davidson today  Will need DM foot exam at next visit. Report recent Eye exam, will attempt to get records.

## 2016-06-17 NOTE — Patient Instructions (Signed)
My plan to support myself in continuing these changes to care for my diabetes is to attend or contact:   Diabetes Support Groups  Type 2 diabetes support group : 2nd Monday of every month from 6-7 PM at [address] [facility] conference room [phone]  Journals ? Diabetes Forecast- [phone]- DirectoryTags.siwww.diabetesforecast.org ? Diabetes Self-Management- [phone]- www.diabetesselfmanagement.com  Apps ? Calorie Brooke DareKing ? Glucose Buddy (Free, tracks blood glucose, graphs)   I'd like to call you in 4-6 months to see how you are doing. Hope that is okay?   Call anytime before that if needed! Lupita LeashDonna [phone]

## 2016-06-17 NOTE — Patient Instructions (Signed)
[name],  I would like you to continue your current medications today. You are doing a great job. We will get you set up to get a meter to check your blood sugars at home. Please check them 3-4 times a day at home.   I would like to see you back in 2 months for follow up.

## 2016-06-18 LAB — MICROALBUMIN / CREATININE URINE RATIO
CREATININE, UR: 109.1 mg/dL
Microalb/Creat Ratio: 3.6 mg/g creat (ref 0.0–30.0)
Microalbumin, Urine: 3.9 ug/mL

## 2016-06-27 ENCOUNTER — Ambulatory Visit: Payer: No Typology Code available for payment source | Admitting: Gastroenterology

## 2016-06-29 NOTE — Addendum Note (Signed)
Addended by: Neomia DearPOWERS, Magdaline Zollars E on: 06/29/2016 08:12 PM   Modules accepted: Orders

## 2016-08-16 ENCOUNTER — Telehealth: Payer: Self-pay | Admitting: Internal Medicine

## 2016-08-16 NOTE — Telephone Encounter (Signed)
APT. REMINDER CALL, LMTCB °

## 2016-08-17 ENCOUNTER — Encounter: Payer: Self-pay | Admitting: Internal Medicine

## 2016-08-17 ENCOUNTER — Ambulatory Visit (INDEPENDENT_AMBULATORY_CARE_PROVIDER_SITE_OTHER): Payer: No Typology Code available for payment source | Admitting: Dietician

## 2016-08-17 ENCOUNTER — Ambulatory Visit (INDEPENDENT_AMBULATORY_CARE_PROVIDER_SITE_OTHER): Payer: No Typology Code available for payment source | Admitting: Internal Medicine

## 2016-08-17 VITALS — BP 122/77 | HR 76 | Temp 98.1°F | Ht 69.0 in | Wt 215.5 lb

## 2016-08-17 DIAGNOSIS — Z23 Encounter for immunization: Secondary | ICD-10-CM

## 2016-08-17 DIAGNOSIS — E118 Type 2 diabetes mellitus with unspecified complications: Secondary | ICD-10-CM

## 2016-08-17 DIAGNOSIS — E119 Type 2 diabetes mellitus without complications: Secondary | ICD-10-CM

## 2016-08-17 DIAGNOSIS — Z6830 Body mass index (BMI) 30.0-30.9, adult: Secondary | ICD-10-CM

## 2016-08-17 DIAGNOSIS — Z0000 Encounter for general adult medical examination without abnormal findings: Secondary | ICD-10-CM

## 2016-08-17 DIAGNOSIS — Z713 Dietary counseling and surveillance: Secondary | ICD-10-CM | POA: Diagnosis not present

## 2016-08-17 LAB — POCT GLYCOSYLATED HEMOGLOBIN (HGB A1C): Hemoglobin A1C: 5.8

## 2016-08-17 LAB — GLUCOSE, CAPILLARY: GLUCOSE-CAPILLARY: 106 mg/dL — AB (ref 65–99)

## 2016-08-17 MED ORDER — METFORMIN HCL 1000 MG PO TABS: 1000.0000 mg | ORAL_TABLET | Freq: Two times a day (BID) | ORAL | 2 refills | Status: DC
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
## 2016-08-17 NOTE — Patient Instructions (Addendum)
[name],  You have done an amazing job! Your A1c today is back to the normal range at 5.8. Keep up the good work. We can stop the insulin today. Please continue the Metformin but let us know if you have any problems with it. You should hear from us about scheduling you for a colonoscopy.   Please follow up with me in 6 months.

## 2016-08-17 NOTE — Assessment & Plan Note (Addendum)
Agreeable to getting colonoscopy done. Refer to GI today.  Received Tdap today.

## 2016-08-17 NOTE — Assessment & Plan Note (Addendum)
Lab Results  Component Value Date   HGBA1C >14.0 05/04/2016    Derek Davidson was recently diagnosed with Type 2 DM following 05/04/16 visit with complaints of unintentional weight loss, polyuria and polydipsea. HgbA1c was >14 at that time. He was started on Levemir 9 units qhs and metformin 1000 mg bid His follow up visit 06/17/16 he was doing well with improvement in his symptoms.   Today, his CBG is 106 and A1C is 5.8. He reports he has been taking metformin as prescribed. However, stopped his insulin because his CBGs have been doing well. Reports he stopped it approximately 6 weeks ago.   Has stopped his sweet tea, koolaid and soft drinks. Has changed it diet dramatically. Only drinking water during the day. Does report drinking some fruit juice at night but has been watching his sugar intake and checking his CBGs. Walking 30 minutes 5x day a week. Denies any hypoglycemia.   Assessment: Improved DM II  Plan: DM foot exam done today. Will discontinue insulin today. Continue metformin as tolerated.

## 2016-08-18 ENCOUNTER — Encounter: Payer: Self-pay | Admitting: Dietician

## 2016-08-18 NOTE — Patient Instructions (Addendum)
Dear Derek Davidson,  Good Job taking care of your diabetes!!!  I enclosed the instructions on how to use your smartphone with your glucose meter. I am happy to help you or you can also call [employer] at the number on the papers.   I also set as your goal: checking your blood sugar every day- which you did 100% way to go?   I forgot to ask you:  1- What is your diabetes self care goal until we meet next time?   _________________________________    2- If you have had or scheduled a dental exam?  Dental care is important in diabetes as it can be a source of infections and high blood sugar can be hard on teeth and gums.  Diet and Dental Disease What you eat affects the health of your teeth. Choosing the right foods can help you to keep your teeth healthy and avoid dental problems, such as tooth decay and gum disease. You need to know which foods can help to keep your teeth strong and which foods can cause problems for your teeth. If you do not get the proper nutrients in your diet, your body may be less able to prevent dental disease. Practicing good oral hygiene is very important in maintaining healthy teeth and gums. This includes brushing your teeth twice daily with fluoride toothpaste, flossing between your teeth daily, and visiting your dentist regularly to have your teeth cleaned professionally. What general guidelines do I need to follow? Food Guidelines   Eat a healthy, well-balanced diet that includes a variety of foods in moderate amounts every day. Eat less of foods that contain high amounts of sugar or easily digested (refined), simple carbohydrates. Your diet should include:  Fiber-rich fruits and vegetables.  Lean sources of protein. These include eggs, lean meat, chicken, fish, and low-fat or fat-free dairy products.  Whole grains. These include brown rice, whole-wheat bread, and pasta.  Fresh, whole, and unprocessed foods. These foods help you to produce more saliva, which is  important for good dental health.  Try to wait at least 2 hours between meals, snacks, and drinks.  Avoid foods that are rich in simple sugars or carbohydrates, such as candies, cakes, and syrups. Eating these foods often over a long period of time can increase your risk of developing cavities (dental caries).  Avoid eating sticky or acidic foods by themselves, such as caramel, dried fruit, jams, or citrus fruits. If you eat these types of foods, eat them with meals rather than between meals.  Chew sugar-free gum right after a meal or snack. Beverage Guidelines   Rinse your mouth with water after eating sugary snacks.  Avoid sipping drinks that are sweetened with sugar, such as soda. Sipping these drinks for prolonged periods can increase your risk of dental caries.  Avoid holding or swishing acidic or sugary drinks in your mouth. What foods may increase my risk of dental problems?  Any food or drink that contains sugar or is sweetened with sugar. These should be avoided in general or only eaten in moderation. This list includes fruit drinks, carbonated beverages, sport or energy drinks, as well as sweetened coffees and teas.  Sticky foods. These include raisins, other dried fruit, and candies that are sticky, hard, or slow to melt. It is best to avoid these.  Foods that are high in refined carbohydrates or higher in sugar. These include cookies, cakes, muffins, chips, and crackers.  Simple sugars, such as sucrose, honey, and molasses.  Acidic  foods and drinks. These include tomatoes, citrus fruits, coffee, and fruit juice. These foods and drinks can weaken tooth enamel, and that can cause tooth sensitivity, erosion, and pitting. The items listed above may not be a complete list of foods and beverages to limit or avoid. Contact your dietitian for more information.  What foods may decrease my risk of dental problems?  Foods that are high in vitamins C and A, such as fresh fruits and  vegetables. These vitamins are important for keeping gums healthy and building tooth enamel.  High-quality protein foods. These include lean meats, eggs, cheese, milk, yogurt, fish, beans, and legumes.  Whole-grain breads and cereals that are low in sugar.  Sugar-free chewing gum and sugar-free mints.  Foods that are good sources of calcium. These include cheese, milk, plain yogurt, calcium-fortified tofu, leafy greens, and almonds.  Water, especially fluoridated water.     3- If you are still checking your feet every day? It is important to get in the habit of checking your feet every day.   Diabetes and Foot Care Diabetes may cause you to have problems because of poor blood supply (circulation) to your feet and legs. This may cause the skin on your feet to become thinner, break easier, and heal more slowly. Your skin may become dry, and the skin may peel and crack. You may also have nerve damage in your legs and feet causing decreased feeling in them. You may not notice minor injuries to your feet that could lead to infections or more serious problems. Taking care of your feet is one of the most important things you can do for yourself. Follow these instructions at home:  Wear shoes at all times, even in the house. Do not go barefoot. Bare feet are easily injured.  Check your feet daily for blisters, cuts, and redness. If you cannot see the bottom of your feet, use a mirror or ask someone for help.  Wash your feet with warm water (do not use hot water) and mild soap. Then pat your feet and the areas between your toes until they are completely dry. Do not soak your feet as this can dry your skin.  Apply a moisturizing lotion or petroleum jelly (that does not contain alcohol and is unscented) to the skin on your feet and to dry, brittle toenails. Do not apply lotion between your toes.  Trim your toenails straight across. Do not dig under them or around the cuticle. File the edges of your  nails with an emery board or nail file.  Do not cut corns or calluses or try to remove them with medicine.  Wear clean socks or stockings every day. Make sure they are not too tight. Do not wear knee-high stockings since they may decrease blood flow to your legs.  Wear shoes that fit properly and have enough cushioning. To break in new shoes, wear them for just a few hours a day. This prevents you from injuring your feet. Always look in your shoes before you put them on to be sure there are no objects inside.  Do not cross your legs. This may decrease the blood flow to your feet.  If you find a minor scrape, cut, or break in the skin on your feet, keep it and the skin around it clean and dry. These areas may be cleansed with mild soap and water. Do not cleanse the area with peroxide, alcohol, or iodine.  When you remove an adhesive bandage, be sure not  to damage the skin around it.  If you have a wound, look at it several times a day to make sure it is healing.  Do not use heating pads or hot water bottles. They may burn your skin. If you have lost feeling in your feet or legs, you may not know it is happening until it is too late.  Make sure your health care provider performs a complete foot exam at least annually or more often if you have foot problems. Report any cuts, sores, or bruises to your health care provider immediately. Contact a health care provider if:  You have an injury that is not healing.  You have cuts or breaks in the skin.  You have an ingrown nail.  You notice redness on your legs or feet.  You feel burning or tingling in your legs or feet.  You have pain or cramps in your legs and feet.  Your legs or feet are numb.  Your feet always feel cold. Get help right away if:  There is increasing redness, swelling, or pain in or around a wound.  There is a red line that goes up your leg.  Pus is coming from a wound.  You develop a fever or as directed by your  health care provider.  You notice a bad smell coming from an ulcer or wound.

## 2016-08-18 NOTE — Progress Notes (Signed)
Diabetes Self-Management Education  Visit Type:  Follow-up  Appt. Start Time: 1310 Appt. End Time: 1350  08/18/2016  [name], identified by name and date of birth, is a 51 y.o. male with a diagnosis of Diabetes:  .   ASSESSMENT  A1C- 5.8% ~ eAG of 120 Weight-215#,  BMI.-30       Diabetes Self-Management Education - 08/18/16 0900      Psychosocial Assessment   Patient Belief/Attitude about Diabetes Motivated to manage diabetes   Self-care barriers None   Self-management support Doctor's office;Friends;Family;CDE visits   Patient Concerns Nutrition/Meal planning;Glycemic Control   Special Needs None   Preferred Learning Style No preference indicated   Learning Readiness Change in progress     Complications   Last HgB A1C per patient/outside source 5.8 %   How often do you check your blood sugar? 1-2 times/day   Postprandial Blood glucose range (mg/dL) 16-109;604-540   Number of hypoglycemic episodes per month --  0   Number of hyperglycemic episodes per week 0   Have you had a dilated eye exam in the past 12 months? No   Have you had a dental exam in the past 12 months? No   Are you checking your feet? Yes   How many days per week are you checking your feet? 7     Dietary Intake   Breakfast  3-5AM   Dinner 1-3PM chicken o fish and vegetables   Beverage(s) water, diet soda, some pepsi     Exercise   Exercise Type ADL's;Light (walking / raking leaves)   How many days per week to you exercise? 5  5   How many minutes per day do you exercise? 45  he showed me his phone- he gets >10000 steps 5 days/week   Total minutes per week of exercise 225     Patient Education   Previous Diabetes Education Yes (please comment)  here 6 weeks ago   Nutrition management  Reviewed blood glucose goals for pre and post meals and how to evaluate the patients' food intake on their blood glucose level.   Physical activity and exercise  Role of exercise on diabetes management,  blood pressure control and cardiac health.   Monitoring Taught/discussed recording of test results and interpretation of SMBG.;Identified appropriate SMBG and/or A1C goals.;Daily foot exams   Acute complications Discussed and identified patients' treatment of hyperglycemia.   Chronic complications Dental care     Individualized Goals (developed by patient)   Monitoring  test my blood glucose as discussed     Patient Self-Evaluation of Goals - Patient rates self as meeting previously set goals (% of time)   Monitoring >75%     Outcomes   Program Status Not Completed     Subsequent Visit   Since your last visit have you continued or begun to take your medications as prescribed? No  stopped insulin- his blood sugars were well controlled   Since your last visit have you had your blood pressure checked? No   Since your last visit have you experienced any weight changes? Gain   Weight Gain (lbs) 10  he says he feels better at this weight   Since your last visit, are you checking your blood glucose at least once a day? Yes      Learning Objective:  Patient will have a greater understanding of diabetes self-management. Patient education plan is to attend individual and/or group sessions per assessed needs and concerns.  My plan to  support myself in continuing these changes to care for my diabetes is to attend or contact:   Has significant other with whom he talks daily about his diabetes doctor's office, CDE, Dietitian, pharmacist, church .  Plan:   Patient Instructions  Dear [name],  Good Job taking care of your diabetes!!!  I enclosed the instructions on how to use your smartphone with your glucose meter. I am happy to help you or you can also call [employer] at the number on the papers.   I also set as your goal: checking your blood sugar every day- which you did 100% way to go?   I forgot to ask you:  1- What is your diabetes self care goal until we meet next time?    _________________________________    2- If you have had or scheduled a dental exam?  Dental care is important in diabetes as it can be a source of infections and high blood sugar can be hard on teeth and gums.  Diet and Dental Disease What you eat affects the health of your teeth. Choosing the right foods can help you to keep your teeth healthy and avoid dental problems, such as tooth decay and gum disease. You need to know which foods can help to keep your teeth strong and which foods can cause problems for your teeth. If you do not get the proper nutrients in your diet, your body may be less able to prevent dental disease. Practicing good oral hygiene is very important in maintaining healthy teeth and gums. This includes brushing your teeth twice daily with fluoride toothpaste, flossing between your teeth daily, and visiting your dentist regularly to have your teeth cleaned professionally. What general guidelines do I need to follow? Food Guidelines   Eat a healthy, well-balanced diet that includes a variety of foods in moderate amounts every day. Eat less of foods that contain high amounts of sugar or easily digested (refined), simple carbohydrates. Your diet should include:  Fiber-rich fruits and vegetables.  Lean sources of protein. These include eggs, lean meat, chicken, fish, and low-fat or fat-free dairy products.  Whole grains. These include brown rice, whole-wheat bread, and pasta.  Fresh, whole, and unprocessed foods. These foods help you to produce more saliva, which is important for good dental health.  Try to wait at least 2 hours between meals, snacks, and drinks.  Avoid foods that are rich in simple sugars or carbohydrates, such as candies, cakes, and syrups. Eating these foods often over a long period of time can increase your risk of developing cavities (dental caries).  Avoid eating sticky or acidic foods by themselves, such as caramel, dried fruit, jams, or citrus  fruits. If you eat these types of foods, eat them with meals rather than between meals.  Chew sugar-free gum right after a meal or snack. Beverage Guidelines   Rinse your mouth with water after eating sugary snacks.  Avoid sipping drinks that are sweetened with sugar, such as soda. Sipping these drinks for prolonged periods can increase your risk of dental caries.  Avoid holding or swishing acidic or sugary drinks in your mouth. What foods may increase my risk of dental problems?  Any food or drink that contains sugar or is sweetened with sugar. These should be avoided in general or only eaten in moderation. This list includes fruit drinks, carbonated beverages, sport or energy drinks, as well as sweetened coffees and teas.  Sticky foods. These include raisins, other dried fruit, and candies that  are sticky, hard, or slow to melt. It is best to avoid these.  Foods that are high in refined carbohydrates or higher in sugar. These include cookies, cakes, muffins, chips, and crackers.  Simple sugars, such as sucrose, honey, and molasses.  Acidic foods and drinks. These include tomatoes, citrus fruits, coffee, and fruit juice. These foods and drinks can weaken tooth enamel, and that can cause tooth sensitivity, erosion, and pitting. The items listed above may not be a complete list of foods and beverages to limit or avoid. Contact your dietitian for more information.  What foods may decrease my risk of dental problems?  Foods that are high in vitamins C and A, such as fresh fruits and vegetables. These vitamins are important for keeping gums healthy and building tooth enamel.  High-quality protein foods. These include lean meats, eggs, cheese, milk, yogurt, fish, beans, and legumes.  Whole-grain breads and cereals that are low in sugar.  Sugar-free chewing gum and sugar-free mints.  Foods that are good sources of calcium. These include cheese, milk, plain yogurt, calcium-fortified tofu,  leafy greens, and almonds.  Water, especially fluoridated water.     3- If you are still checking your feet every day? It is important to get in the habit of checking your feet every day.   Diabetes and Foot Care Diabetes may cause you to have problems because of poor blood supply (circulation) to your feet and legs. This may cause the skin on your feet to become thinner, break easier, and heal more slowly. Your skin may become dry, and the skin may peel and crack. You may also have nerve damage in your legs and feet causing decreased feeling in them. You may not notice minor injuries to your feet that could lead to infections or more serious problems. Taking care of your feet is one of the most important things you can do for yourself. Follow these instructions at home:  Wear shoes at all times, even in the house. Do not go barefoot. Bare feet are easily injured.  Check your feet daily for blisters, cuts, and redness. If you cannot see the bottom of your feet, use a mirror or ask someone for help.  Wash your feet with warm water (do not use hot water) and mild soap. Then pat your feet and the areas between your toes until they are completely dry. Do not soak your feet as this can dry your skin.  Apply a moisturizing lotion or petroleum jelly (that does not contain alcohol and is unscented) to the skin on your feet and to dry, brittle toenails. Do not apply lotion between your toes.  Trim your toenails straight across. Do not dig under them or around the cuticle. File the edges of your nails with an emery board or nail file.  Do not cut corns or calluses or try to remove them with medicine.  Wear clean socks or stockings every day. Make sure they are not too tight. Do not wear knee-high stockings since they may decrease blood flow to your legs.  Wear shoes that fit properly and have enough cushioning. To break in new shoes, wear them for just a few hours a day. This prevents you from  injuring your feet. Always look in your shoes before you put them on to be sure there are no objects inside.  Do not cross your legs. This may decrease the blood flow to your feet.  If you find a minor scrape, cut, or break in the  skin on your feet, keep it and the skin around it clean and dry. These areas may be cleansed with mild soap and water. Do not cleanse the area with peroxide, alcohol, or iodine.  When you remove an adhesive bandage, be sure not to damage the skin around it.  If you have a wound, look at it several times a day to make sure it is healing.  Do not use heating pads or hot water bottles. They may burn your skin. If you have lost feeling in your feet or legs, you may not know it is happening until it is too late.  Make sure your health care provider performs a complete foot exam at least annually or more often if you have foot problems. Report any cuts, sores, or bruises to your health care provider immediately. Contact a health care provider if:  You have an injury that is not healing.  You have cuts or breaks in the skin.  You have an ingrown nail.  You notice redness on your legs or feet.  You feel burning or tingling in your legs or feet.  You have pain or cramps in your legs and feet.  Your legs or feet are numb.  Your feet always feel cold. Get help right away if:  There is increasing redness, swelling, or pain in or around a wound.  There is a red line that goes up your leg.  Pus is coming from a wound.  You develop a fever or as directed by your health care provider.  You notice a bad smell coming from an ulcer or wound.    Expected Outcomes:  Demonstrated interest in learning. Expect positive outcomes  Education material provided: A1C conversion sheet  If problems or questions, patient to contact team via:  Phone  Future DSME appointment: - 3-4 months

## 2016-08-23 NOTE — Progress Notes (Signed)
[facility] Attending  Case discussed with Dr. Boswell at the time of the visit.  We reviewed the resident's history and exam and pertinent patient test results.  I agree with the assessment, diagnosis, and plan of care documented in the resident's note.  

## 2016-11-23 ENCOUNTER — Telehealth: Payer: Self-pay | Admitting: Dietician

## 2016-11-23 NOTE — Telephone Encounter (Signed)
Called patient for his diabetes follow up: last visit was 3.21/18  DSMT Outcome or Education reassessment  Assessment/Scale: 1= needs instruction 2= needs review 3= comprehends key points 4= demonstrates understanding/competency [place]= not covered N/A= not applicable  Education Outcome /reassessment  Diabetes disease process and Treatment process Define diabetes and identify own type of diabetes; list 3 options for treatingdiabetes NA  Incorporating nutritional management into lifestyle Describe effect of type, amount and timing of food on blood glucose; list 3 methods for planning meals 4- lot's of veggies, ,moderate portions, three meals a day  Incorporating physical activity into lifestyle State effect of exercise on blood glucose levels NA  Using Medications safely State effect of diabetes medicines on diabetes; name diabetes medication taking, action and side effects NA  Monitoring blood glucose, interpreting and using results Identify recommended blood glucose targets and personal targets  3- reviewed target glucose   Prevention, detection, and treatment of acute complications List symptoms of hyper- and hypoglycemia; describe how to treat low blood sugar and actions for lowering high blood glucose levels NA  Prevention, detection and treatment of chronic complications Define the natural course of diabetes and describe the relationship of blood glucose levelsto long term complications of diabetes NA  Developing strategies to address psychosocial issues Describe feelings about living with diabetes; identify support needed and support network NA  Developing strategies to promote health/change behavior Define the ABCs of diabetes; identify appropriate screenings,schedule and personal plan for screenings NA       Participant Selected DSMS Plan  __Nutrtrion & Monitoring ______ _______________  Participant Selected Behavioral Goal/s and Outcomes:   _________Nutrition- rates himself a  9-10/10_________________________________  Comments:  ___________Saw [facility]. Amada KingfisherJohn Lindsay [facility] [place], report was good. He asked their office to fax us the records. He agreed to annual visits  Tashonna Descoteaux, Lupita LeashDonna, RD 11/23/2016 12:42 PM.

## 2016-11-23 NOTE — Telephone Encounter (Signed)
Called Mr. Delford FieldWright about DSMT fu and Eye exam.

## 2016-12-22 ENCOUNTER — Telehealth: Payer: Self-pay | Admitting: *Deleted

## 2016-12-22 NOTE — Telephone Encounter (Signed)
CALLED AND LEFT MESSAGE FOR PATIENT TO COME TO CLINIC TO PICK UP KIT FROM LAB (COLON SCREENING KIT). PATIENT TO CALL [facility] IF HAVE ANY QUESTIONS REGARDING THIS CALL. 

## 2017-02-15 ENCOUNTER — Encounter: Payer: No Typology Code available for payment source | Admitting: Internal Medicine

## 2017-03-01 ENCOUNTER — Encounter: Payer: No Typology Code available for payment source | Admitting: Internal Medicine

## 2017-03-20 ENCOUNTER — Encounter: Payer: Self-pay | Admitting: *Deleted

## 2017-08-06 ENCOUNTER — Encounter (HOSPITAL_COMMUNITY): Payer: Self-pay

## 2017-08-06 ENCOUNTER — Emergency Department (HOSPITAL_COMMUNITY): Payer: PRIVATE HEALTH INSURANCE

## 2017-08-06 ENCOUNTER — Emergency Department (HOSPITAL_COMMUNITY)
Admission: EM | Admit: 2017-08-06 | Discharge: 2017-08-06 | Disposition: A | Discharge status: Home / Self Care | Payer: PRIVATE HEALTH INSURANCE | Attending: Emergency Medicine | Admitting: Emergency Medicine

## 2017-08-06 DIAGNOSIS — E119 Type 2 diabetes mellitus without complications: Secondary | ICD-10-CM | POA: Diagnosis not present

## 2017-08-06 DIAGNOSIS — L03113 Cellulitis of right upper limb: Secondary | ICD-10-CM | POA: Diagnosis not present

## 2017-08-06 DIAGNOSIS — M25521 Pain in right elbow: Secondary | ICD-10-CM | POA: Diagnosis present

## 2017-08-06 DIAGNOSIS — Z7984 Long term (current) use of oral hypoglycemic drugs: Secondary | ICD-10-CM | POA: Diagnosis not present

## 2017-08-06 LAB — CBC WITH DIFFERENTIAL/PLATELET
BASOS ABS: 0 10*3/uL (ref 0.0–0.1)
Basophils Relative: 0 %
EOS PCT: 0 %
Eosinophils Absolute: 0 10*3/uL (ref 0.0–0.7)
HEMATOCRIT: 46.1 % (ref 39.0–52.0)
Hemoglobin: 16.6 g/dL (ref 13.0–17.0)
LYMPHS ABS: 1.7 10*3/uL (ref 0.7–4.0)
LYMPHS PCT: 11 %
MCH: 29.9 pg (ref 26.0–34.0)
MCHC: 36 g/dL (ref 30.0–36.0)
MCV: 82.9 fL (ref 78.0–100.0)
MONO ABS: 1.8 10*3/uL — AB (ref 0.1–1.0)
MONOS PCT: 11 %
NEUTROS ABS: 12.7 10*3/uL — AB (ref 1.7–7.7)
Neutrophils Relative %: 78 %
Platelets: 140 10*3/uL — ABNORMAL LOW (ref 150–400)
RBC: 5.56 MIL/uL (ref 4.22–5.81)
RDW: 12.6 % (ref 11.5–15.5)
WBC: 16.2 10*3/uL — ABNORMAL HIGH (ref 4.0–10.5)

## 2017-08-06 LAB — BASIC METABOLIC PANEL
ANION GAP: 11 (ref 5–15)
BUN: 13 mg/dL (ref 6–20)
CALCIUM: 9 mg/dL (ref 8.9–10.3)
CO2: 22 mmol/L (ref 22–32)
Chloride: 99 mmol/L — ABNORMAL LOW (ref 101–111)
Creatinine, Ser: 0.88 mg/dL (ref 0.61–1.24)
GFR calc Af Amer: >60 mL/min (ref >60)
GFR calc non Af Amer: >60 mL/min (ref >60)
Glucose, Bld: 254 mg/dL — ABNORMAL HIGH (ref 65–99)
Potassium: 4.3 mmol/L (ref 3.5–5.1)
Sodium: 132 mmol/L — ABNORMAL LOW (ref 135–145)

## 2017-08-06 MED ORDER — IBUPROFEN 800 MG PO TABS
800.0000 mg | ORAL_TABLET | Freq: Once | ORAL | Status: AC
  Administered 2017-08-06: 800 mg via ORAL
  Filled 2017-08-06: qty 1

## 2017-08-06 MED ORDER — SODIUM CHLORIDE 0.9 % IV BOLUS (SEPSIS)
500.0000 mL | Freq: Once | INTRAVENOUS | Status: AC
  Administered 2017-08-06: 500 mL via INTRAVENOUS

## 2017-08-06 MED ORDER — VANCOMYCIN HCL IN DEXTROSE 1-5 GM/200ML-% IV SOLN
1000.0000 mg | Freq: Once | INTRAVENOUS | Status: AC
  Administered 2017-08-06: 1000 mg via INTRAVENOUS
  Filled 2017-08-06: qty 200

## 2017-08-06 MED ORDER — IBUPROFEN 800 MG PO TABS: 800.0000 mg | ORAL_TABLET | Freq: Three times a day (TID) | ORAL | 0 refills | Status: DC

## 2017-08-06 MED ORDER — LIDOCAINE HCL (PF) 1 % IJ SOLN
5.0000 mL | Freq: Once | INTRAMUSCULAR | Status: AC
  Administered 2017-08-06: 5 mL
  Filled 2017-08-06: qty 5

## 2017-08-06 MED ORDER — DOXYCYCLINE HYCLATE 100 MG PO CAPS: 100.0000 mg | ORAL_CAPSULE | Freq: Two times a day (BID) | ORAL | 0 refills | Status: AC

## 2017-08-06 NOTE — ED Notes (Signed)
Declined W/C at D/C and was escorted to lobby by RN. 

## 2017-08-06 NOTE — ED Triage Notes (Signed)
Patient complains of 3 days of increasing right elbow pain and swelling, denies trauma. Warm to touch. No drainage
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
## 2017-08-06 NOTE — ED Notes (Signed)
Patient transported to X-ray notified XR to return pt to correct room 

## 2017-08-06 NOTE — Discharge Instructions (Addendum)
As discussed, take your entire course of antibiotics even if you feel better.  Monitor for any worsening and return if pain worsen, swelling worsens, fever, chills, nausea vomiting or any other new concerning symptoms.  Follow-up in 48-72 hours for recheck. Make sure that you stay well-hydrated and follow-up with your primary care provider. Return to the ER if any new concerning symptoms or worsening.

## 2017-08-14 ENCOUNTER — Encounter: Payer: Self-pay | Admitting: Internal Medicine

## 2017-08-14 ENCOUNTER — Ambulatory Visit (INDEPENDENT_AMBULATORY_CARE_PROVIDER_SITE_OTHER): Payer: No Typology Code available for payment source | Admitting: Internal Medicine

## 2017-08-14 DIAGNOSIS — L03113 Cellulitis of right upper limb: Secondary | ICD-10-CM

## 2017-08-14 DIAGNOSIS — Z791 Long term (current) use of non-steroidal anti-inflammatories (NSAID): Secondary | ICD-10-CM | POA: Diagnosis not present

## 2017-08-14 DIAGNOSIS — M7021 Olecranon bursitis, right elbow: Secondary | ICD-10-CM | POA: Insufficient documentation

## 2017-08-14 NOTE — Assessment & Plan Note (Addendum)
Patient presenting for follow up of right elbow cellulitis. [facility]. He had been experiencing several days of worsening right elbow erythema, edema, and pain at that time. He had not experienced any specific trauma that he could recall. WBC was elevated to 16.2. Xray showed no sign of join infection or osteomyelitis. Patient was treated with 1g IV vancomycin and discharged with 7 days of doxyxyline and instruction for close follow up.  He presents today as he is finisihing up is antibiotic course. He states that his symptoms have all significantly improved and his range of motion has completely returned to normal. He denies any fevers or chills. On exam, right elbow mildly edematous, erythematous 3-4 inch diameter, non-tender, full ROM. Last A1c 08/17/16 5.8, in ED CBG 254. Patient's cellulitis is resolving with prescribed antibiotic therapy some residual inflammation remains, which patient is taking NSAIDs for. Suspect inflammation to continue to improve. Patient instructed to return to clinic for worsening/spreading redness, pain, or swelling. - Continue OTC NSAIDs for inflammation - Monitor progress - Return precautions given

## 2017-08-14 NOTE — Patient Instructions (Signed)
Thank you for allowing us to care for you  For your right elbow cellulitis - This is improved as expected on the antibiotic you were prescribed - Continue to take anti-inflammatory medication to keep swelling down as the are heals - Return to clinic if you notice worsening / spreading redness, pain, or swelling

## 2017-08-14 NOTE — Progress Notes (Signed)
Internal Medicine Clinic Attending  Case discussed with Dr. Melvin  at the time of the visit.  We reviewed the resident's history and exam and pertinent patient test results.  I agree with the assessment, diagnosis, and plan of care documented in the resident's note.  
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
## 2017-08-30 ENCOUNTER — Telehealth: Payer: Self-pay | Admitting: Internal Medicine

## 2017-08-30 NOTE — Telephone Encounter (Signed)
Patient is wanting to know if the physician can prescribe the patient some more medicine for his cellulites, it is clearing up but it is still red and he is out of medicine

## 2017-09-01 NOTE — Telephone Encounter (Signed)
Call from pt - stated he had called 2 days ago about his elbow. Wants to know if the [facility] he saw [facility] Stated he took abx completely but feels like his elbow is not completely healed. Warm to touch, redness has not spread-only red at tip of the elbow, decrease swelling and soreness. If u call the pt - stated may leave a message.

## 2017-09-01 NOTE — Telephone Encounter (Signed)
Talked to pt earlier today - informed he will need to schedule an appt to eval his elbow per Dr Alinda MoneyMelvin. Call transferred to front office - [facility]

## 2017-09-01 NOTE — Telephone Encounter (Signed)
[facility] before new medication can be prescribed. Thank you.

## 2017-09-04 ENCOUNTER — Other Ambulatory Visit: Payer: Self-pay

## 2017-09-04 ENCOUNTER — Ambulatory Visit (INDEPENDENT_AMBULATORY_CARE_PROVIDER_SITE_OTHER): Payer: No Typology Code available for payment source | Admitting: Internal Medicine

## 2017-09-04 VITALS — BP 127/87 | HR 92 | Temp 98.1°F | Ht 69.0 in | Wt 218.0 lb

## 2017-09-04 DIAGNOSIS — M7021 Olecranon bursitis, right elbow: Secondary | ICD-10-CM

## 2017-09-04 DIAGNOSIS — Z791 Long term (current) use of non-steroidal anti-inflammatories (NSAID): Secondary | ICD-10-CM | POA: Diagnosis not present

## 2017-09-04 MED ORDER — NAPROXEN 500 MG PO TABS: 500.0000 mg | ORAL_TABLET | Freq: Two times a day (BID) | ORAL | 0 refills | Status: DC
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
## 2017-09-04 NOTE — Patient Instructions (Addendum)
FOLLOW-UP INSTRUCTIONS When: 2 weeks For: Elbow pain f/u (may cancel if better) What to bring: N/A   [name],  It was a pleasure to meet you. I am sorry to hear about your arm pain. Please take naproxen twice daily with meals for the next 7-10 days. Please purchase a padded elbow sleep to wear during the day, especially while you work, to help off load some of the pressure on your elbow. Please follow up in 2 weeks if your pain does not improve. If you have any questions or concerns, call our clinic at [phone] or after hours call [phone] and ask for the internal medicine resident on call. Thank you!   - Dr. Antony ContrasGuilloud

## 2017-09-04 NOTE — Assessment & Plan Note (Signed)
Patient is here for follow-up of right elbow pain.  Patient seen in the ED for this 1 month ago and was told he had cellulitis.  He was treated with PO Doxycycline.  He followed up in our clinic 1 week later and noted to have persistent swelling, however improvement in his erythema and pain.  He is presented again today for persistent pain and swelling.  On exam he has soft tissue swelling surrounding his right elbow, tenderness to palpation over his olecranon.  There is no erythema or evidence of cellulitis at this time. Denies fevers chills.  Point-of-care ultrasound reveals a small fluid collection consistent with olecranon bursitis.  Triceps tendon appears intact.  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  We discussed conservative treatment with a one-week course of NSAIDs and a padded elbow brace to offload pressure.  Instructed patient to return if symptoms persist. --Naproxen 500 mg twice daily x 7-10 days --DME order for elbow brace --Follow-up as needed

## 2017-09-07 NOTE — Progress Notes (Signed)
Internal Medicine Clinic Attending  I saw and evaluated the patient.  I personally confirmed the key portions of the history and exam documented by Dr. Guilloud and I reviewed pertinent patient test results.  The assessment, diagnosis, and plan were formulated together and I agree with the documentation in the resident's note.  

## 2017-11-26 ENCOUNTER — Encounter: Payer: Self-pay | Admitting: *Deleted

## 2018-08-29 ENCOUNTER — Encounter: Payer: No Typology Code available for payment source | Admitting: Internal Medicine

## 2018-08-29 ENCOUNTER — Telehealth: Payer: Self-pay | Admitting: Internal Medicine

## 2018-08-29 ENCOUNTER — Other Ambulatory Visit: Payer: Self-pay

## 2018-08-29 NOTE — Telephone Encounter (Signed)
Attempted calling patient, no answer. Left a message letting him know tele-health visits are available and to give [place] a call back if he needs anything.

## 2018-11-25 ENCOUNTER — Encounter: Payer: Self-pay | Admitting: *Deleted

## 2018-12-12 ENCOUNTER — Ambulatory Visit
Admission: EM | Admit: 2018-12-12 | Discharge: 2018-12-12 | Disposition: A | Discharge status: Home / Self Care | Payer: Managed Care, Other (non HMO) | Attending: Physician Assistant | Admitting: Physician Assistant

## 2018-12-12 DIAGNOSIS — R739 Hyperglycemia, unspecified: Secondary | ICD-10-CM | POA: Diagnosis not present

## 2018-12-12 DIAGNOSIS — R110 Nausea: Secondary | ICD-10-CM | POA: Diagnosis not present

## 2018-12-12 DIAGNOSIS — E119 Type 2 diabetes mellitus without complications: Secondary | ICD-10-CM | POA: Diagnosis not present

## 2018-12-12 LAB — POCT URINALYSIS DIP (MANUAL ENTRY)
Bilirubin, UA: NEGATIVE
Blood, UA: NEGATIVE
Glucose, UA: >=1000 mg/dL — AB
Leukocytes, UA: NEGATIVE
Nitrite, UA: NEGATIVE
Protein Ur, POC: NEGATIVE mg/dL
Spec Grav, UA: 1.01 (ref 1.010–1.025)
Urobilinogen, UA: 0.2 E.U./dL
pH, UA: 5 (ref 5.0–8.0)

## 2018-12-12 LAB — POCT FASTING CBG KUC MANUAL ENTRY: POCT Glucose (KUC): 417 mg/dL — AB (ref 70–99)

## 2018-12-12 MED ORDER — ONDANSETRON 4 MG PO TBDP: 4.0000 mg | ORAL_TABLET | Freq: Three times a day (TID) | ORAL | 0 refills | Status: DC | PRN

## 2018-12-12 MED ORDER — METFORMIN HCL 500 MG PO TABS: 500.0000 mg | ORAL_TABLET | Freq: Two times a day (BID) | ORAL | 0 refills | Status: DC
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
## 2018-12-12 NOTE — ED Triage Notes (Signed)
Pt c/o center abdominal pain with nausea since yesterday morning.

## 2018-12-12 NOTE — Discharge Instructions (Addendum)
Your urine showed glucose in the urine, no infection. Blood glucose 417. This may be the cause of discomfort. Start metformin as directed. Zofran as needed for nausea. Keep hydrated, urine should be clear to pale yellow in color. Follow up with PCP for further evaluation and management needed. If experiencing worsening symptoms, sudden worsening of abdominal pain, vomiting despite medicine, weakness, dizziness, diarrhea, go to the ED for further evaluation needed.

## 2019-01-18 ENCOUNTER — Telehealth: Payer: Self-pay

## 2019-01-18 NOTE — Telephone Encounter (Signed)
Called patient to do their pre-visit COVID screening.  Call went to voicemail. Unable to do prescreening.  

## 2019-01-21 ENCOUNTER — Other Ambulatory Visit: Payer: Self-pay

## 2019-01-21 ENCOUNTER — Ambulatory Visit (INDEPENDENT_AMBULATORY_CARE_PROVIDER_SITE_OTHER): Payer: Managed Care, Other (non HMO) | Admitting: Family Medicine

## 2019-01-21 ENCOUNTER — Encounter: Payer: Self-pay | Admitting: Family Medicine

## 2019-01-21 DIAGNOSIS — Z7689 Persons encountering health services in other specified circumstances: Secondary | ICD-10-CM

## 2019-01-21 DIAGNOSIS — E1165 Type 2 diabetes mellitus with hyperglycemia: Secondary | ICD-10-CM | POA: Diagnosis not present

## 2019-01-21 LAB — GLUCOSE, POCT (MANUAL RESULT ENTRY): POC Glucose: 329 mg/dL — AB (ref 70–99)

## 2019-01-21 MED ORDER — GLIPIZIDE 5 MG PO TABS: 5.0000 mg | ORAL_TABLET | Freq: Two times a day (BID) | ORAL | 3 refills | Status: DC

## 2019-01-21 MED ORDER — METFORMIN HCL 500 MG PO TABS: 1000.0000 mg | ORAL_TABLET | Freq: Two times a day (BID) | ORAL | 5 refills | Status: DC

## 2019-01-21 NOTE — Patient Instructions (Signed)
Preventing Diabetes Mellitus Complications You can take action to prevent or slow down problems that are caused by diabetes (diabetes mellitus). Following your diabetes plan and taking care of yourself can reduce your risk of serious or life-threatening complications. What actions can I take to prevent diabetes complications? Manage your diabetes   Follow instructions from your health care providers about managing your diabetes. Your diabetes may be managed by a team of health care providers who can teach you how to care for yourself and can answer questions that you have.  Educate yourself about your condition so you can make healthy choices about eating and physical activity.  Check your blood sugar (glucose) levels as often as directed. Your health care provider will help you decide how often to check your blood glucose level depending on your treatment goals and how well you are meeting them.  Ask your health care provider if you should take low-dose aspirin daily and what dose is recommended for you. Taking low-dose aspirin daily is recommended to help prevent cardiovascular disease. Do not use nicotine or tobacco Do not use any products that contain nicotine or tobacco, such as cigarettes and e-cigarettes. If you need help quitting, ask your health care provider. Nicotine raises your risk for diabetes problems. If you quit using nicotine:  You will lower your risk for heart attack, stroke, nerve disease, and kidney disease.  Your cholesterol and blood pressure may improve.  Your blood circulation will improve. Keep your blood pressure under control Your personal target blood pressure is determined based on:  Your age.  Your medicines.  How long you have had diabetes.  Any other medical conditions you have. To control your blood pressure:  Follow instructions from your health care provider about meal planning, exercise, and medicines.  Make sure your health care provider  checks your blood pressure at every medical visit.  Monitor your blood pressure at home as told by your health care provider.  Keep your cholesterol under control To control your cholesterol:  Follow instructions from your health care provider about meal planning, exercise, and medicines.  Have your cholesterol checked at least once a year.  You may be prescribed medicine to lower cholesterol (statin). If you are not taking a statin, ask your health care provider if you should be. Controlling your cholesterol may:  Help prevent heart disease and stroke. These are the most common health problems for people with diabetes.  Improve your blood flow. Schedule and keep yearly physical exams and eye exams Your health care provider will tell you how often you need medical visits depending on your diabetes management plan. Keep all follow-up visits as directed. This is important so possible problems can be identified early and complications can be avoided or treated.  Every visit with your health care provider should include measuring your: ? Weight. ? Blood pressure. ? Blood glucose control.  Your A1c (hemoglobin A1c) level should be checked: ? At least 2 times a year, if you are meeting your treatment goals. ? 4 times a year, if you are not meeting treatment goals or if your treatment goals have changed.  Your blood lipids (lipid profile) should be checked yearly. You should also be checked yearly for protein in your urine (urine microalbumin).  If you have type 1 diabetes, get an eye exam 3-5 years after you are diagnosed, and then once a year after your first exam.  If you have type 2 diabetes, get an eye exam as soon as you   are diagnosed, and then once a year after your first exam. Keep your vaccines current It is recommended that you receive:  A flu (influenza) vaccine every year.  A pneumonia (pneumococcal) vaccine and a hepatitis B vaccine. If you are age 65 or older, you may  get the pneumonia vaccine as a series of two separate shots. Ask your health care provider which other vaccines may be recommended. Take care of your feet Diabetes may cause you to have poor blood circulation to your legs and feet. Because of this, taking care of your feet is very important. Diabetes can cause:  The skin on the feet to get thinner, break more easily, and heal more slowly.  Nerve damage in your legs and feet, which results in decreased feeling. You may not notice minor injuries that could lead to serious problems. To avoid foot problems:  Check your skin and feet every day for cuts, bruises, redness, blisters, or sores.  Schedule a foot exam with your health care provider once every year. This exam includes: ? Inspecting of the structure and skin of your feet. ? Checking the pulses and sensation in your feet.  Make sure that your health care provider performs a visual foot exam at every medical visit.  Take care of your teeth People with poorly controlled diabetes are more likely to have gum (periodontal) disease. Diabetes can make periodontal diseases harder to control. If not treated, periodontal diseases can lead to tooth loss. To prevent this:  Brush your teeth twice a day.  Floss at least once a day.  Visit your dentist 2 times a year. Drink responsibly Limit alcohol intake to no more than 1 drink a day for nonpregnant women and 2 drinks a day for men. One drink equals 12 oz of beer, 5 oz of wine, or 1 oz of hard liquor.  It is important to eat food when you drink alcohol to avoid low blood glucose (hypoglycemia). Avoid alcohol if you:  Have a history of alcohol abuse or dependence.  Are pregnant.  Have liver disease, pancreatitis, advanced neuropathy, or severe hypertriglyceridemia. Lessen stress Living with diabetes can be stressful. When you are experiencing stress, your blood glucose may be affected in two ways:  Stress hormones may cause your blood  glucose to rise.  You may be distracted from taking good care of yourself. Be aware of your stress level and make changes to help you manage challenging situations. To lower your stress levels:  Consider joining a support group.  Do planned relaxation or meditation.  Do a hobby that you enjoy.  Maintain healthy relationships.  Exercise regularly.  Work with your health care provider or a mental health professional. Summary  You can take action to prevent or slow down problems that are caused by diabetes (diabetes mellitus). Following your diabetes plan and taking care of yourself can reduce your risk of serious or life-threatening complications.  Follow instructions from your health care providers about managing your diabetes. Your diabetes may be managed by a team of health care providers who can teach you how to care for yourself and can answer questions that you have.  Your health care provider will tell you how often you need medical visits depending on your diabetes management plan. Keep all follow-up visits as directed. This is important so possible problems can be identified early and complications can be avoided or treated. This information is not intended to replace advice given to you by your health care provider. Make sure   you discuss any questions you have with your health care provider. Document Released: 02/01/2011 Document Revised: 08/14/2017 Document Reviewed: 02/13/2016 Elsevier Patient Education  2020 [employer]  

## 2019-01-21 NOTE — Progress Notes (Signed)
Patient ran out of the Metformin last week. Has c/o polyuria & polydipsia. Denies nausea, vomiting, vision changes, numbness/tingling in his feet.  CBG 329
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
## 2019-01-22 LAB — COMPREHENSIVE METABOLIC PANEL WITH GFR
ALT: 36 IU/L (ref 0–44)
AST: 20 IU/L (ref 0–40)
Albumin/Globulin Ratio: 2.3 — ABNORMAL HIGH (ref 1.2–2.2)
Albumin: 4.8 g/dL (ref 3.8–4.9)
Alkaline Phosphatase: 95 IU/L (ref 39–117)
BUN/Creatinine Ratio: 17 (ref 9–20)
BUN: 16 mg/dL (ref 6–24)
Bilirubin Total: 0.7 mg/dL (ref 0.0–1.2)
CO2: 22 mmol/L (ref 20–29)
Calcium: 9.1 mg/dL (ref 8.7–10.2)
Chloride: 101 mmol/L (ref 96–106)
Creatinine, Ser: 0.94 mg/dL (ref 0.76–1.27)
GFR calc Af Amer: 106 mL/min/1.73
GFR calc non Af Amer: 92 mL/min/1.73
Globulin, Total: 2.1 g/dL (ref 1.5–4.5)
Glucose: 333 mg/dL — ABNORMAL HIGH (ref 65–99)
Potassium: 4.1 mmol/L (ref 3.5–5.2)
Sodium: 137 mmol/L (ref 134–144)
Total Protein: 6.9 g/dL (ref 6.0–8.5)

## 2019-01-22 LAB — HEMOGLOBIN A1C
Est. average glucose Bld gHb Est-mCnc: 321 mg/dL
Hgb A1c MFr Bld: 12.8 % — ABNORMAL HIGH (ref 4.8–5.6)

## 2019-01-22 LAB — MICROALBUMIN / CREATININE URINE RATIO
Creatinine, Urine: 94.8 mg/dL
Microalb/Creat Ratio: 19 mg/g{creat} (ref 0–29)
Microalbumin, Urine: 17.8 ug/mL

## 2019-01-22 LAB — LIPID PANEL
Chol/HDL Ratio: 3.6 ratio (ref 0.0–5.0)
Cholesterol, Total: 103 mg/dL (ref 100–199)
HDL: 29 mg/dL — ABNORMAL LOW
LDL Calculated: 51 mg/dL (ref 0–99)
Triglycerides: 117 mg/dL (ref 0–149)
VLDL Cholesterol Cal: 23 mg/dL (ref 5–40)

## 2019-02-14 ENCOUNTER — Telehealth: Payer: Self-pay

## 2019-02-14 NOTE — Telephone Encounter (Signed)
Called patient to do their pre-visit COVID screening.  Call went to voicemail. Unable to do prescreening.  

## 2019-02-18 ENCOUNTER — Ambulatory Visit: Payer: No Typology Code available for payment source | Admitting: Family Medicine

## 2019-02-18 ENCOUNTER — Other Ambulatory Visit: Payer: Self-pay

## 2019-02-18 ENCOUNTER — Ambulatory Visit (INDEPENDENT_AMBULATORY_CARE_PROVIDER_SITE_OTHER): Payer: Managed Care, Other (non HMO) | Admitting: Family Medicine

## 2019-02-18 VITALS — BP 134/84 | HR 86 | Temp 97.3°F | Resp 17 | Ht 70.0 in | Wt 196.6 lb

## 2019-02-18 DIAGNOSIS — E1165 Type 2 diabetes mellitus with hyperglycemia: Secondary | ICD-10-CM | POA: Diagnosis not present

## 2019-02-18 LAB — GLUCOSE, POCT (MANUAL RESULT ENTRY): POC Glucose: 464 mg/dL — AB (ref 70–99)

## 2019-02-18 MED ORDER — GLIPIZIDE 10 MG PO TABS: 10.0000 mg | ORAL_TABLET | Freq: Two times a day (BID) | ORAL | 3 refills | Status: DC

## 2019-02-18 MED ORDER — INSULIN ASPART 100 UNIT/ML ~~LOC~~ SOLN
12.0000 [IU] | Freq: Once | SUBCUTANEOUS | Status: AC
  Administered 2019-02-18: 12 [IU] via SUBCUTANEOUS
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
## 2019-02-18 NOTE — Progress Notes (Signed)
Patient notified of results & recommendations during office visit today. Expressed understanding.

## 2019-02-18 NOTE — Progress Notes (Signed)
Doesn't check blood sugars regularly at home. Maybe once a week.  Denies nausea, vomiting, numbness/tingling in feet, polydipsia. Has some polyuria.  Not fasting. Had [employer] for breakfast(biscuit, fries & Pepsi)
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
## 2019-02-18 NOTE — Patient Instructions (Signed)
Blood Glucose Monitoring, Adult °Monitoring your blood sugar (glucose) is an important part of managing your diabetes (diabetes mellitus). Blood glucose monitoring involves checking your blood glucose as often as directed and keeping a record (log) of your results over time. °Checking your blood glucose regularly and keeping a blood glucose log can: °· Help you and your health care provider adjust your diabetes management plan as needed, including your medicines or insulin. °· Help you understand how food, exercise, illnesses, and medicines affect your blood glucose. °· Let you know what your blood glucose is at any time. You can quickly find out if you have low blood glucose (hypoglycemia) or high blood glucose (hyperglycemia). °Your health care provider will set individualized treatment goals for you. Your goals will be based on your age, other medical conditions you have, and how you respond to diabetes treatment. Generally, the goal of treatment is to maintain the following blood glucose levels: °· Before meals (preprandial): 80-130 mg/dL (4.4-7.2 mmol/L). °· After meals (postprandial): below 180 mg/dL (10 mmol/L). °· A1c level: less than 7%. °Supplies needed: °· Blood glucose meter. °· Test strips for your meter. Each meter has its own strips. You must use the strips that came with your meter. °· A needle to prick your finger (lancet). Do not use a lancet more than one time. °· A device that holds the lancet (lancing device). °· A journal or log book to write down your results. °How to check your blood glucose ° °1. Wash your hands with soap and water. °2. Prick the side of your finger (not the tip) with the lancet. Use a different finger each time. °3. Gently rub the finger until a small drop of blood appears. °4. Follow instructions that come with your meter for inserting the test strip, applying blood to the strip, and using your blood glucose meter. °5. Write down your result and any notes. °Some meters  allow you to use areas of your body other than your finger (alternative sites) to test your blood. The most common alternative sites are: °· Forearm. °· Thigh. °· Palm of the hand. °If you think you may have hypoglycemia, or if you have a history of not knowing when your blood glucose is getting low (hypoglycemia unawareness), do not use alternative sites. Use your finger instead. Alternative sites may not be as accurate as the fingers, because blood flow is slower in these areas. This means that the result you get may be delayed, and it may be different from the result that you would get from your finger. °Follow these instructions at home: °Blood glucose log ° °· Every time you check your blood glucose, write down your result. Also write down any notes about things that may be affecting your blood glucose, such as your diet and exercise for the day. This information can help you and your health care provider: °? Look for patterns in your blood glucose over time. °? Adjust your diabetes management plan as needed. °· Check if your meter allows you to download your records to a computer. Most glucose meters store a record of glucose readings in the meter. °If you have type 1 diabetes: °· Check your blood glucose 2 or more times a day. °· Also check your blood glucose: °? Before every insulin injection. °? Before and after exercise. °? Before meals. °? 2 hours after a meal. °? Occasionally between 2:00 a.m. and 3:00 a.m., as directed. °? Before potentially dangerous tasks, like driving or using heavy machinery. °?   At bedtime. °· You may need to check your blood glucose more often, up to 6-10 times a day, if you: °? Use an insulin pump. °? Need multiple daily injections (MDI). °? Have diabetes that is not well-controlled. °? Are ill. °? Have a history of severe hypoglycemia. °? Have hypoglycemia unawareness. °If you have type 2 diabetes: °· If you take insulin or other diabetes medicines, check your blood glucose 2 or  more times a day. °· If you are on intensive insulin therapy, check your blood glucose 4 or more times a day. Occasionally, you may also need to check between 2:00 a.m. and 3:00 a.m., as directed. °· Also check your blood glucose: °? Before and after exercise. °? Before potentially dangerous tasks, like driving or using heavy machinery. °· You may need to check your blood glucose more often if: °? Your medicine is being adjusted. °? Your diabetes is not well-controlled. °? You are ill. °General tips °· Always keep your supplies with you. °· If you have questions or need help, all blood glucose meters have a 24-hour "hotline" phone number that you can call. You may also contact your health care provider. °· After you use a few boxes of test strips, adjust (calibrate) your blood glucose meter by following instructions that came with your meter. °Contact a health care provider if: °· Your blood glucose is at or above 240 mg/dL (13.3 mmol/L) for 2 days in a row. °· You have been sick or have had a fever for 2 days or longer, and you are not getting better. °· You have any of the following problems for more than 6 hours: °? You cannot eat or drink. °? You have nausea or vomiting. °? You have diarrhea. °Get help right away if: °· Your blood glucose is lower than 54 mg/dL (3 mmol/L). °· You become confused or you have trouble thinking clearly. °· You have difficulty breathing. °· You have moderate or large ketone levels in your urine. °Summary °· Monitoring your blood sugar (glucose) is an important part of managing your diabetes (diabetes mellitus). °· Blood glucose monitoring involves checking your blood glucose as often as directed and keeping a record (log) of your results over time. °· Your health care provider will set individualized treatment goals for you. Your goals will be based on your age, other medical conditions you have, and how you respond to diabetes treatment. °· Every time you check your blood glucose,  write down your result. Also write down any notes about things that may be affecting your blood glucose, such as your diet and exercise for the day. °This information is not intended to replace advice given to you by your health care provider. Make sure you discuss any questions you have with your health care provider. °Document Released: 05/19/2003 Document Revised: 03/09/2018 Document Reviewed: 10/26/2015 °Elsevier Patient Education © 2020 [employer] ° °

## 2019-03-04 ENCOUNTER — Ambulatory Visit: Payer: Managed Care, Other (non HMO)

## 2019-03-18 ENCOUNTER — Ambulatory Visit: Payer: Managed Care, Other (non HMO) | Admitting: Pharmacist

## 2019-03-18 NOTE — Progress Notes (Deleted)
    S:     No chief complaint on file.   Patient arrives ***.  Presents for diabetes evaluation, education, and management Patient was referred and last seen by Primary Care Provider on ***.  Patient was referred on ***.  Patient was last seen by Primary Care Provider on ***.   Patient reports Diabetes was diagnosed in ***.   Family/Social History: ***  Insurance coverage/medication affordability: ***  Patient {Actions; denies-reports:120008} adherence with medications.  Current diabetes medications include: *** Current hypertension medications include: *** Current hyperlipidemia medications include: ***  Patient {Actions; denies-reports:120008} hypoglycemic events.  Patient reported dietary habits: Eats *** meals/day Breakfast:*** Lunch:*** Dinner:*** Snacks:*** Drinks:***  Patient-reported exercise habits: ***   Patient {Actions; denies-reports:120008} nocturia.  Patient {Actions; denies-reports:120008} neuropathy. Patient {Actions; denies-reports:120008} visual changes. Patient {Actions; denies-reports:120008} self foot exams.     O:  Physical Exam   ROS   Lab Results  Component Value Date   HGBA1C 12.8 (H) 01/21/2019   There were no vitals filed for this visit.  Lipid Panel     Component Value Date/Time   CHOL 103 01/21/2019 1039   TRIG 117 01/21/2019 1039   HDL 29 (L) 01/21/2019 1039   CHOLHDL 3.6 01/21/2019 1039   CHOLHDL 3.1 01/27/2012 1632   VLDL 22 01/27/2012 1632   LDLCALC 51 01/21/2019 1039    Home fasting CBG: ***  2 hour post-prandial/random CBG: ***.   Clinical ASCVD: {YES/NO:21197} The ASCVD Risk score Mikey Bussing DC Jr., et al., 2013) failed to calculate for the following reasons:   The valid total cholesterol range is 130 to 320 mg/dL    A/P: Diabetes longstanding*** currently ***. Patient is not*** able to verbalize appropriate hypoglycemia management plan. Patient {Is/is not:9024} adherent with medication. Control is suboptimal  due to ***. -{Meds adjust:18428} basal insulin *** (insulin ***). Patient will continue to titrate 1 unit every ***days if fasting CBGs > 100mg /dl until fasting CBGs reach goal or next visit.  -{Meds adjust:18428}  rapid insulin *** (insulin ***) to ***.  -{Meds adjust:18428} GLP-1 *** (generic name***) to ***.  -{Meds adjust:18428} SGLT2-I *** (generic name***) to ***. Counseled on sick day rules for ***. -Extensively discussed pathophysiology of DM, recommended lifestyle interventions, dietary effects on glycemic control -Counseled on s/sx of and management of hypoglycemia -Next A1C anticipated ***.   ASCVD risk - primary***secondary prevention in patient with DM. Last LDL {Is/is not:9024} controlled. ASCVD risk score {Is/is not:9024} >20%  - {Desc; low/moderate/high:110033} intensity statin indicated. Aspirin {Is/is not:9024} indicated.  -{Meds adjust:18428} aspirin *** mg  -{Meds adjust:18428} ***statin *** mg.   Hypertension longstanding*** currently ***.  BP goal = *** mmHg. Patient {Is/is not:9024} adherent with medication. Control is suboptimal due to ***. -***  Written patient instructions provided.  Total time in face to face counseling *** minutes.   Follow up Pharmacist/PCP*** Clinic Visit in ***.   Patient seen with ***

## 2019-05-10 ENCOUNTER — Telehealth: Payer: Self-pay

## 2019-05-10 NOTE — Telephone Encounter (Signed)
Called patient to do their pre-visit COVID screening.  Call went to voicemail. Unable to do prescreening.  

## 2019-05-13 ENCOUNTER — Ambulatory Visit: Payer: Managed Care, Other (non HMO)

## 2019-05-14 ENCOUNTER — Encounter: Payer: Self-pay | Admitting: Emergency Medicine

## 2019-05-14 ENCOUNTER — Ambulatory Visit
Admission: EM | Admit: 2019-05-14 | Discharge: 2019-05-14 | Disposition: A | Discharge status: Home / Self Care | Payer: Managed Care, Other (non HMO) | Attending: Emergency Medicine | Admitting: Emergency Medicine

## 2019-05-14 ENCOUNTER — Other Ambulatory Visit: Payer: Self-pay

## 2019-05-14 DIAGNOSIS — R109 Unspecified abdominal pain: Secondary | ICD-10-CM

## 2019-05-14 DIAGNOSIS — E119 Type 2 diabetes mellitus without complications: Secondary | ICD-10-CM

## 2019-05-14 DIAGNOSIS — T50905A Adverse effect of unspecified drugs, medicaments and biological substances, initial encounter: Secondary | ICD-10-CM | POA: Diagnosis not present

## 2019-05-14 DIAGNOSIS — R110 Nausea: Secondary | ICD-10-CM

## 2019-05-14 LAB — POCT FASTING CBG KUC MANUAL ENTRY: POCT Glucose (KUC): 123 mg/dL — AB (ref 70–99)

## 2019-05-14 MED ORDER — ONDANSETRON HCL 4 MG PO TABS: 4.0000 mg | ORAL_TABLET | Freq: Four times a day (QID) | ORAL | 0 refills | Status: DC

## 2019-05-14 MED ORDER — ONDANSETRON 4 MG PO TBDP
4.0000 mg | ORAL_TABLET | Freq: Once | ORAL | Status: AC
  Administered 2019-05-14: 13:00:00 4 mg via ORAL

## 2019-05-14 NOTE — ED Triage Notes (Signed)
Pt presents to [facility] for assessment after 2-3 months of abdominal cramping, nausea, and loose stools.  Patient states he thinks it may be caused by his metformin and glipizide.  Patient states over the past three days he has been having an increase in the cramping, the diarrhea.  Patient states he missed his follow-up appointment with PCP yesterday Irena Reichmann PCP).

## 2019-05-14 NOTE — Discharge Instructions (Signed)
Cefdinir prescribed. Important to keep your primary care appointment on 05/16/2019, arrive at 3:15 PM to discuss further diabetic management. In the meantime, please go to ER for worsening nausea, vomiting, abdominal pain, fever, chest pain, shortness of breath.
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
## 2019-05-15 ENCOUNTER — Telehealth: Payer: Self-pay

## 2019-05-15 NOTE — Telephone Encounter (Signed)
Called patient to do their pre-visit COVID screening.  Call went to voicemail. Unable to do prescreening.  

## 2019-05-16 ENCOUNTER — Ambulatory Visit (INDEPENDENT_AMBULATORY_CARE_PROVIDER_SITE_OTHER): Payer: Managed Care, Other (non HMO) | Admitting: Critical Care Medicine

## 2019-05-16 ENCOUNTER — Other Ambulatory Visit: Payer: Self-pay

## 2019-05-16 VITALS — BP 139/87 | HR 95 | Temp 97.3°F | Resp 17 | Wt 208.0 lb

## 2019-05-16 DIAGNOSIS — Z1211 Encounter for screening for malignant neoplasm of colon: Secondary | ICD-10-CM

## 2019-05-16 DIAGNOSIS — E1165 Type 2 diabetes mellitus with hyperglycemia: Secondary | ICD-10-CM

## 2019-05-16 MED ORDER — TRULICITY 0.75 MG/0.5ML ~~LOC~~ SOAJ: 75.0000 ug | SUBCUTANEOUS | 4 refills | Status: DC

## 2019-05-16 MED ORDER — METFORMIN HCL 500 MG PO TABS: 500.0000 mg | ORAL_TABLET | Freq: Two times a day (BID) | ORAL | 4 refills | Status: DC
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
## 2019-05-16 NOTE — Patient Instructions (Addendum)
Discontinue glipizide  Reduce Metformin to 500 mg twice daily  Begin Trulicity once weekly  A referral to gastroenterology is made for a colonoscopy  Follow a diabetic diet as outlined below  Make sure you push plenty of fluids and increase your exercise level by moving your body simply by walking 30 minutes 3-4 times a week    Return in 2 months for primary care physician followup    Diabetes Mellitus and Exercise Exercising regularly is important for your overall health, especially when you have diabetes (diabetes mellitus). Exercising is not only about losing weight. It has many other health benefits, such as increasing muscle strength and bone density and reducing body fat and stress. This leads to improved fitness, flexibility, and endurance, all of which result in better overall health. Exercise has additional benefits for people with diabetes, including:  Reducing appetite.  Helping to lower and control blood glucose.  Lowering blood pressure.  Helping to control amounts of fatty substances (lipids) in the blood, such as cholesterol and triglycerides.  Helping the body to respond better to insulin (improving insulin sensitivity).  Reducing how much insulin the body needs.  Decreasing the risk for heart disease by: ? Lowering cholesterol and triglyceride levels. ? Increasing the levels of good cholesterol. ? Lowering blood glucose levels. What is my activity plan? Your health care provider or certified diabetes educator can help you make a plan for the type and frequency of exercise (activity plan) that works for you. Make sure that you:  Do at least 150 minutes of moderate-intensity or vigorous-intensity exercise each week. This could be brisk walking, biking, or water aerobics. ? Do stretching and strength exercises, such as yoga or weightlifting, at least 2 times a week. ? Spread out your activity over at least 3 days of the week.  Get some form of physical  activity every day. ? Do not go more than 2 days in a row without some kind of physical activity. ? Avoid being inactive for more than 30 minutes at a time. Take frequent breaks to walk or stretch.  Choose a type of exercise or activity that you enjoy, and set realistic goals.  Start slowly, and gradually increase the intensity of your exercise over time. What do I need to know about managing my diabetes?   Check your blood glucose before and after exercising. ? If your blood glucose is 240 mg/dL (60.413.3 mmol/L) or higher before you exercise, check your urine for ketones. If you have ketones in your urine, do not exercise until your blood glucose returns to normal. ? If your blood glucose is 100 mg/dL (5.6 mmol/L) or lower, eat a snack containing 15-20 grams of carbohydrate. Check your blood glucose 15 minutes after the snack to make sure that your level is above 100 mg/dL (5.6 mmol/L) before you start your exercise.  Know the symptoms of low blood glucose (hypoglycemia) and how to treat it. Your risk for hypoglycemia increases during and after exercise. Common symptoms of hypoglycemia can include: ? Hunger. ? Anxiety. ? Sweating and feeling clammy. ? Confusion. ? Dizziness or feeling light-headed. ? Increased heart rate or palpitations. ? Blurry vision. ? Tingling or numbness around the mouth, lips, or tongue. ? Tremors or shakes. ? Irritability.  Keep a rapid-acting carbohydrate snack available before, during, and after exercise to help prevent or treat hypoglycemia.  Avoid injecting insulin into areas of the body that are going to be exercised. For example, avoid injecting insulin into: ? The arms,  when playing tennis. ? The legs, when jogging.  Keep records of your exercise habits. Doing this can help you and your health care provider adjust your diabetes management plan as needed. Write down: ? Food that you eat before and after you exercise. ? Blood glucose levels before and  after you exercise. ? The type and amount of exercise you have done. ? When your insulin is expected to peak, if you use insulin. Avoid exercising at times when your insulin is peaking.  When you start a new exercise or activity, work with your health care provider to make sure the activity is safe for you, and to adjust your insulin, medicines, or food intake as needed.  Drink plenty of water while you exercise to prevent dehydration or heat stroke. Drink enough fluid to keep your urine clear or pale yellow. Summary  Exercising regularly is important for your overall health, especially when you have diabetes (diabetes mellitus).  Exercising has many health benefits, such as increasing muscle strength and bone density and reducing body fat and stress.  Your health care provider or certified diabetes educator can help you make a plan for the type and frequency of exercise (activity plan) that works for you.  When you start a new exercise or activity, work with your health care provider to make sure the activity is safe for you, and to adjust your insulin, medicines, or food intake as needed. This information is not intended to replace advice given to you by your health care provider. Make sure you discuss any questions you have with your health care provider. Document Released: 08/06/2003 Document Revised: 12/08/2016 Document Reviewed: 10/26/2015 Elsevier Patient Education  [place].  Diabetes Mellitus and Nutrition, Adult When you have diabetes (diabetes mellitus), it is very important to have healthy eating habits because your blood sugar (glucose) levels are greatly affected by what you eat and drink. Eating healthy foods in the appropriate amounts, at about the same times every day, can help you:  Control your blood glucose.  Lower your risk of heart disease.  Improve your blood pressure.  Reach or maintain a healthy weight. Every person with diabetes is different, and each  person has different needs for a meal plan. Your health care provider may recommend that you work with a diet and nutrition specialist (dietitian) to make a meal plan that is best for you. Your meal plan may vary depending on factors such as:  The calories you need.  The medicines you take.  Your weight.  Your blood glucose, blood pressure, and cholesterol levels.  Your activity level.  Other health conditions you have, such as heart or kidney disease. How do carbohydrates affect me? Carbohydrates, also called carbs, affect your blood glucose level more than any other type of food. Eating carbs naturally raises the amount of glucose in your blood. Carb counting is a method for keeping track of how many carbs you eat. Counting carbs is important to keep your blood glucose at a healthy level, especially if you use insulin or take certain oral diabetes medicines. It is important to know how many carbs you can safely have in each meal. This is different for every person. Your dietitian can help you calculate how many carbs you should have at each meal and for each snack. Foods that contain carbs include:  Bread, cereal, rice, pasta, and crackers.  Potatoes and corn.  Peas, beans, and lentils.  Milk and yogurt.  Fruit and juice.  Desserts, such  as cakes, cookies, ice cream, and candy. How does alcohol affect me? Alcohol can cause a sudden decrease in blood glucose (hypoglycemia), especially if you use insulin or take certain oral diabetes medicines. Hypoglycemia can be a life-threatening condition. Symptoms of hypoglycemia (sleepiness, dizziness, and confusion) are similar to symptoms of having too much alcohol. If your health care provider says that alcohol is safe for you, follow these guidelines:  Limit alcohol intake to no more than 1 drink per day for nonpregnant women and 2 drinks per day for men. One drink equals 12 oz of beer, 5 oz of wine, or 1 oz of hard liquor.  Do not drink  on an empty stomach.  Keep yourself hydrated with water, diet soda, or unsweetened iced tea.  Keep in mind that regular soda, juice, and other mixers may contain a lot of sugar and must be counted as carbs. What are tips for following this plan?  Reading food labels  Start by checking the serving size on the "Nutrition Facts" label of packaged foods and drinks. The amount of calories, carbs, fats, and other nutrients listed on the label is based on one serving of the item. Many items contain more than one serving per package.  Check the total grams (g) of carbs in one serving. You can calculate the number of servings of carbs in one serving by dividing the total carbs by 15. For example, if a food has 30 g of total carbs, it would be equal to 2 servings of carbs.  Check the number of grams (g) of saturated and trans fats in one serving. Choose foods that have low or no amount of these fats.  Check the number of milligrams (mg) of salt (sodium) in one serving. Most people should limit total sodium intake to less than 2,300 mg per day.  Always check the nutrition information of foods labeled as "low-fat" or "nonfat". These foods may be higher in added sugar or refined carbs and should be avoided.  Talk to your dietitian to identify your daily goals for nutrients listed on the label. Shopping  Avoid buying canned, premade, or processed foods. These foods tend to be high in fat, sodium, and added sugar.  Shop around the outside edge of the grocery store. This includes fresh fruits and vegetables, bulk grains, fresh meats, and fresh dairy. Cooking  Use low-heat cooking methods, such as baking, instead of high-heat cooking methods like deep frying.  Cook using healthy oils, such as olive, canola, or sunflower oil.  Avoid cooking with butter, cream, or high-fat meats. Meal planning  Eat meals and snacks regularly, preferably at the same times every day. Avoid going long periods of time  without eating.  Eat foods high in fiber, such as fresh fruits, vegetables, beans, and whole grains. Talk to your dietitian about how many servings of carbs you can eat at each meal.  Eat 4-6 ounces (oz) of lean protein each day, such as lean meat, chicken, fish, eggs, or tofu. One oz of lean protein is equal to: ? 1 oz of meat, chicken, or fish. ? 1 egg. ?  cup of tofu.  Eat some foods each day that contain healthy fats, such as avocado, nuts, seeds, and fish. Lifestyle  Check your blood glucose regularly.  Exercise regularly as told by your health care provider. This may include: ? 150 minutes of moderate-intensity or vigorous-intensity exercise each week. This could be brisk walking, biking, or water aerobics. ? Stretching and doing strength exercises,  such as yoga or weightlifting, at least 2 times a week.  Take medicines as told by your health care provider.  Do not use any products that contain nicotine or tobacco, such as cigarettes and e-cigarettes. If you need help quitting, ask your health care provider.  Work with a Veterinary surgeon or diabetes educator to identify strategies to manage stress and any emotional and social challenges. Questions to ask a health care provider  Do I need to meet with a diabetes educator?  Do I need to meet with a dietitian?  What number can I call if I have questions?  When are the best times to check my blood glucose? Where to find more information:  American Diabetes Association: diabetes.org  Academy of Nutrition and Dietetics: www.eatright.[employer] of Diabetes and Digestive and Kidney Diseases (NIH): CarFlippers.tn Summary  A healthy meal plan will help you control your blood glucose and maintain a healthy lifestyle.  Working with a diet and nutrition specialist (dietitian) can help you make a meal plan that is best for you.  Keep in mind that carbohydrates (carbs) and alcohol have immediate effects on your blood  glucose levels. It is important to count carbs and to use alcohol carefully. This information is not intended to replace advice given to you by your health care provider. Make sure you discuss any questions you have with your health care provider. Document Released: 02/10/2005 Document Revised: 04/28/2017 Document Reviewed: 06/20/2016 Elsevier Patient Education  2020 [employer].

## 2019-05-16 NOTE — Assessment & Plan Note (Signed)
Type 2 diabetes without long-term use of insulin and adverse side effect of high-dose Metformin  Plan here will be for the patient to reduce Metformin to 500 mg twice daily and begin Trulicity 75 mcg weekly we went over the process for the Trulicity use and it is approved by his insurance  We will also have this patient scheduled with our clinical pharmacist and we gave the patient a diabetic diet as well to review

## 2019-05-21 ENCOUNTER — Telehealth: Payer: Self-pay

## 2019-05-21 MED ORDER — OZEMPIC (0.25 OR 0.5 MG/DOSE) 2 MG/1.5ML ~~LOC~~ SOPN: 0.5000 mg | PEN_INJECTOR | SUBCUTANEOUS | 4 refills | Status: DC

## 2019-05-21 NOTE — Telephone Encounter (Signed)
Will initiate PA on [email or web address]

## 2019-05-21 NOTE — Telephone Encounter (Signed)
Prior Authorization for patient's Trulicity was denied. Is there an alternative that can be prescribed?

## 2019-05-21 NOTE — Addendum Note (Signed)
Addended by: Elsie Stain on: 05/21/2019 04:35 PM   Modules accepted: Orders

## 2019-05-21 NOTE — Telephone Encounter (Signed)
See if we can get him ozempic  I just put in Rx

## 2019-05-22 NOTE — Addendum Note (Signed)
Addended by: Carylon Perches on: 05/22/2019 09:15 AM   Modules accepted: Orders

## 2019-05-22 NOTE — Telephone Encounter (Signed)
Prior authorization for Ozempic requires a trial & failure of Trulicity. PA rep manually overrode the denial for Trulicity. Approved 05/21/2019-05/29/2038. Will notify patient & pharmacy.

## 2019-06-03 ENCOUNTER — Encounter: Payer: Self-pay | Admitting: Family Medicine

## 2019-06-03 ENCOUNTER — Telehealth: Payer: Self-pay

## 2019-06-03 NOTE — Telephone Encounter (Signed)
Patients wife has tested positive for COVID, UC told him that since she had tested positive and he is having some of the same symptoms, he would be positive as well. Patient is requesting a letter for work due to the positive test and having symptoms.  Please contact patient in regards to letter for work.

## 2019-06-03 NOTE — Progress Notes (Signed)
Patient ID: Derek Davidson, male   DOB: 1966-05-12, 54 y.o.   MRN: [id number]   Patient left phone message that his wife has a positive test for COVID-19 and patient is having some symptoms and wants to have a work note. Work note will be provided but will also have patient notified to seek medical attention of any worsening of symptoms and he will also be offered opportunity for testing.

## 2019-06-04 NOTE — Telephone Encounter (Signed)
Please contact patient and find out what his current symptoms are that he feels are related to COVID-19.  He may need to go to the emergency department for COVID-19 testing and to see if he is a candidate for infusion therapy as he would be considered high risk for complications due to COVID-19 due to his medical issues and infusion therapy may help lessen symptoms from COVID-19.  A letter has been written that patient can take to his employer and letter has been printed but I will need to come into the office tomorrow to sign the letter for patient.

## 2019-06-04 NOTE — Telephone Encounter (Signed)
Began with onset of fever, cough, and body aches. Took antipyretic for fever and mucinex for cough.   Just has body aches at this time.  No SOB.  He went to [facility] and got a rapid Covid test which resulted as detected.  Advised to go to ED if he nor his wife could not get a handle on breathing and became SOB. Also advised to quarantine and wipe surfaces.   Does not wish to be a candidate for infusion therapy.

## 2019-06-13 ENCOUNTER — Ambulatory Visit
Admission: EM | Admit: 2019-06-13 | Discharge: 2019-06-13 | Disposition: A | Discharge status: Home / Self Care | Payer: Managed Care, Other (non HMO)

## 2019-06-17 ENCOUNTER — Ambulatory Visit: Payer: Managed Care, Other (non HMO)

## 2019-07-15 ENCOUNTER — Encounter: Payer: Self-pay | Admitting: Internal Medicine

## 2019-07-15 ENCOUNTER — Other Ambulatory Visit: Payer: Self-pay

## 2019-07-15 ENCOUNTER — Ambulatory Visit (INDEPENDENT_AMBULATORY_CARE_PROVIDER_SITE_OTHER): Payer: Managed Care, Other (non HMO) | Admitting: Internal Medicine

## 2019-07-15 DIAGNOSIS — E1165 Type 2 diabetes mellitus with hyperglycemia: Secondary | ICD-10-CM | POA: Diagnosis not present

## 2019-07-15 DIAGNOSIS — U071 COVID-19: Secondary | ICD-10-CM | POA: Diagnosis not present
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
## 2019-07-22 ENCOUNTER — Other Ambulatory Visit: Payer: Managed Care, Other (non HMO)

## 2019-07-22 ENCOUNTER — Other Ambulatory Visit: Payer: Self-pay

## 2019-07-22 DIAGNOSIS — E1165 Type 2 diabetes mellitus with hyperglycemia: Secondary | ICD-10-CM

## 2019-07-22 NOTE — Progress Notes (Signed)
Patient here for repeat A1C.

## 2019-07-23 LAB — HEMOGLOBIN A1C
Est. average glucose Bld gHb Est-mCnc: 148 mg/dL
Hgb A1c MFr Bld: 6.8 % — ABNORMAL HIGH (ref 4.8–5.6)

## 2019-10-17 ENCOUNTER — Ambulatory Visit
Admission: EM | Admit: 2019-10-17 | Discharge: 2019-10-17 | Disposition: A | Discharge status: Home / Self Care | Payer: Managed Care, Other (non HMO) | Attending: Physician Assistant | Admitting: Physician Assistant

## 2019-10-17 ENCOUNTER — Other Ambulatory Visit: Payer: Self-pay

## 2019-10-17 DIAGNOSIS — R0981 Nasal congestion: Secondary | ICD-10-CM

## 2019-10-17 DIAGNOSIS — L237 Allergic contact dermatitis due to plants, except food: Secondary | ICD-10-CM | POA: Diagnosis not present

## 2019-10-17 DIAGNOSIS — R059 Cough, unspecified: Secondary | ICD-10-CM

## 2019-10-17 DIAGNOSIS — J069 Acute upper respiratory infection, unspecified: Secondary | ICD-10-CM | POA: Diagnosis not present

## 2019-10-17 MED ORDER — FLUTICASONE PROPIONATE 50 MCG/ACT NA SUSP
2.0000 | Freq: Every day | NASAL | 0 refills | Status: DC
Start: 2019-10-17 — End: 2020-03-17

## 2019-10-17 MED ORDER — BENZONATATE 200 MG PO CAPS
200.0000 mg | ORAL_CAPSULE | Freq: Three times a day (TID) | ORAL | 0 refills | Status: DC
Start: 2019-10-17 — End: 2020-03-11

## 2019-10-17 MED ORDER — TRIAMCINOLONE ACETONIDE 0.1 % EX CREA: 1.0000 "application " | TOPICAL_CREAM | Freq: Two times a day (BID) | CUTANEOUS | 0 refills | Status: DC

## 2019-10-17 MED ORDER — IPRATROPIUM BROMIDE 0.06 % NA SOLN
2.0000 | Freq: Four times a day (QID) | NASAL | 0 refills | Status: DC
Start: 2019-10-17 — End: 2020-03-17

## 2019-10-17 MED ORDER — TRIAMCINOLONE ACETONIDE 0.025 % EX OINT: 1.0000 "application " | TOPICAL_OINTMENT | Freq: Two times a day (BID) | CUTANEOUS | 0 refills | Status: DC

## 2019-10-17 NOTE — ED Triage Notes (Signed)
Pt c/o sore throat, nasal congestion, and body aches since Tuesday evening. Pt also c/o a rash to rt forearm x4 days. Denies loss of taste or smell. Denies having covid vaccine.

## 2019-10-17 NOTE — Discharge Instructions (Signed)
Cough/nasal congestion COVID PCR testing ordered. I would like you to quarantine until testing results. Tessalon for cough. Start flonase, atrovent nasal spray for nasal congestion/drainage. You can use over the counter nasal saline rinse such as neti pot for nasal congestion. Keep hydrated, your urine should be clear to pale yellow in color. Tylenol/motrin for fever and pain. Monitor for any worsening of symptoms, chest pain, shortness of breath, wheezing, swelling of the throat, go to the emergency department for further evaluation needed.   Poison ivy dermatitis Triamcinolone cream as directed. If still having symptoms, add triamcinolone ointment at night. Monitor for signs of infection including spreading redness, warmth, pain.
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
## 2019-10-18 LAB — SARS-COV-2, NAA 2 DAY TAT

## 2019-10-18 LAB — NOVEL CORONAVIRUS, NAA: SARS-CoV-2, NAA: NOT DETECTED

## 2019-12-10 IMAGING — DX DG ELBOW COMPLETE 3+V*R*
4 series · 4 of 4 positions shown · non-contrast
Comparison: None.

CLINICAL DATA: Patient complains of 3 days of increasing right
elbow pain and swelling, denies trauma. Warm to touch. No drainage

EXAM:
RIGHT ELBOW - COMPLETE 3+ VIEW

[elbow ap]
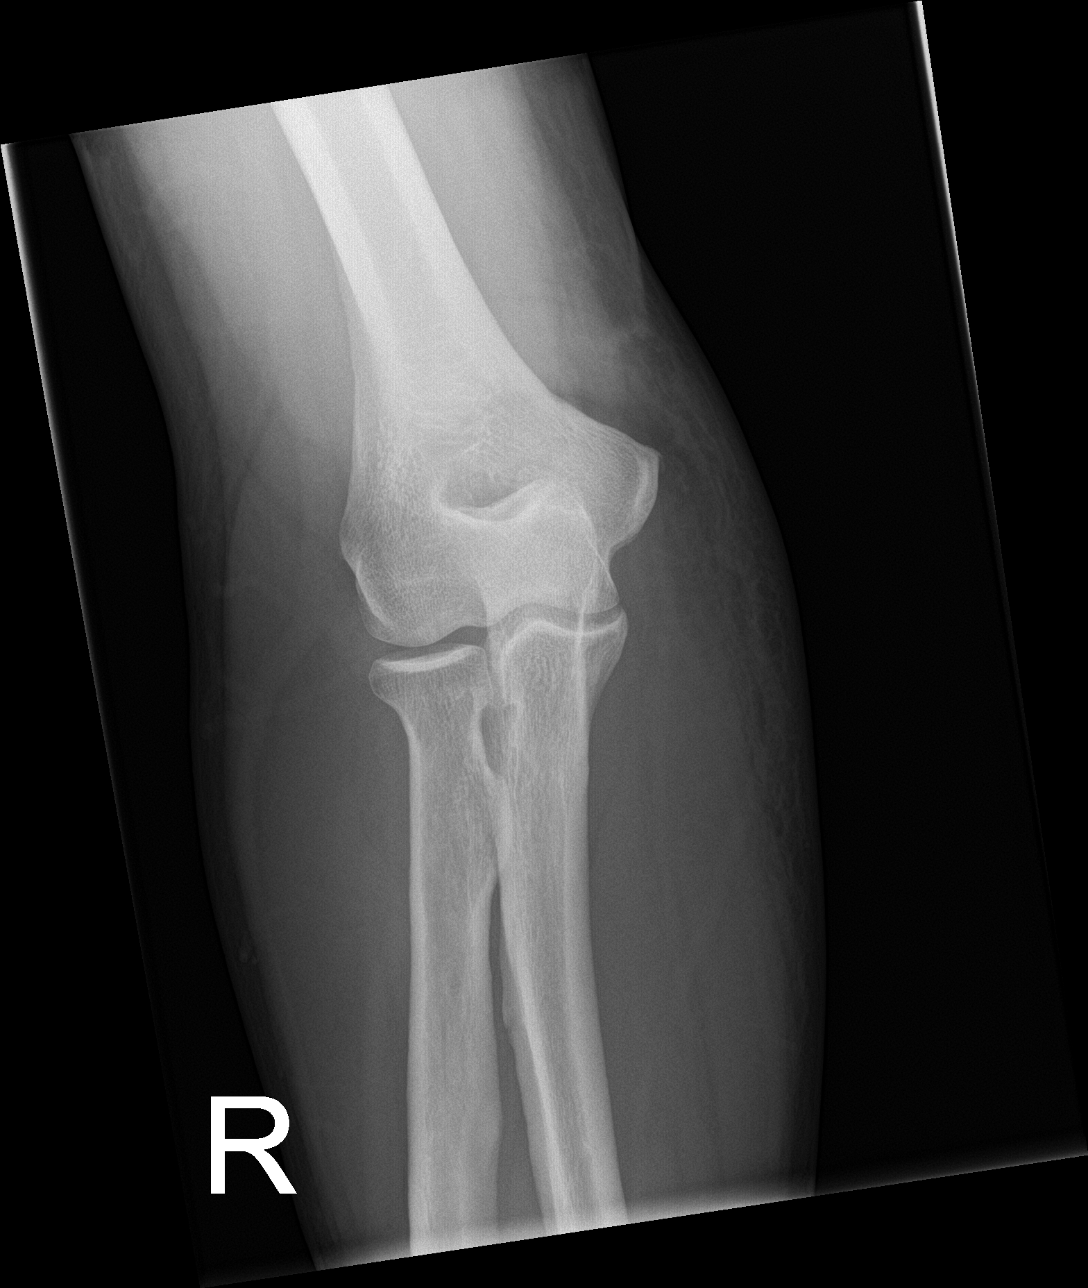

[elbow obl (1 of 2)]
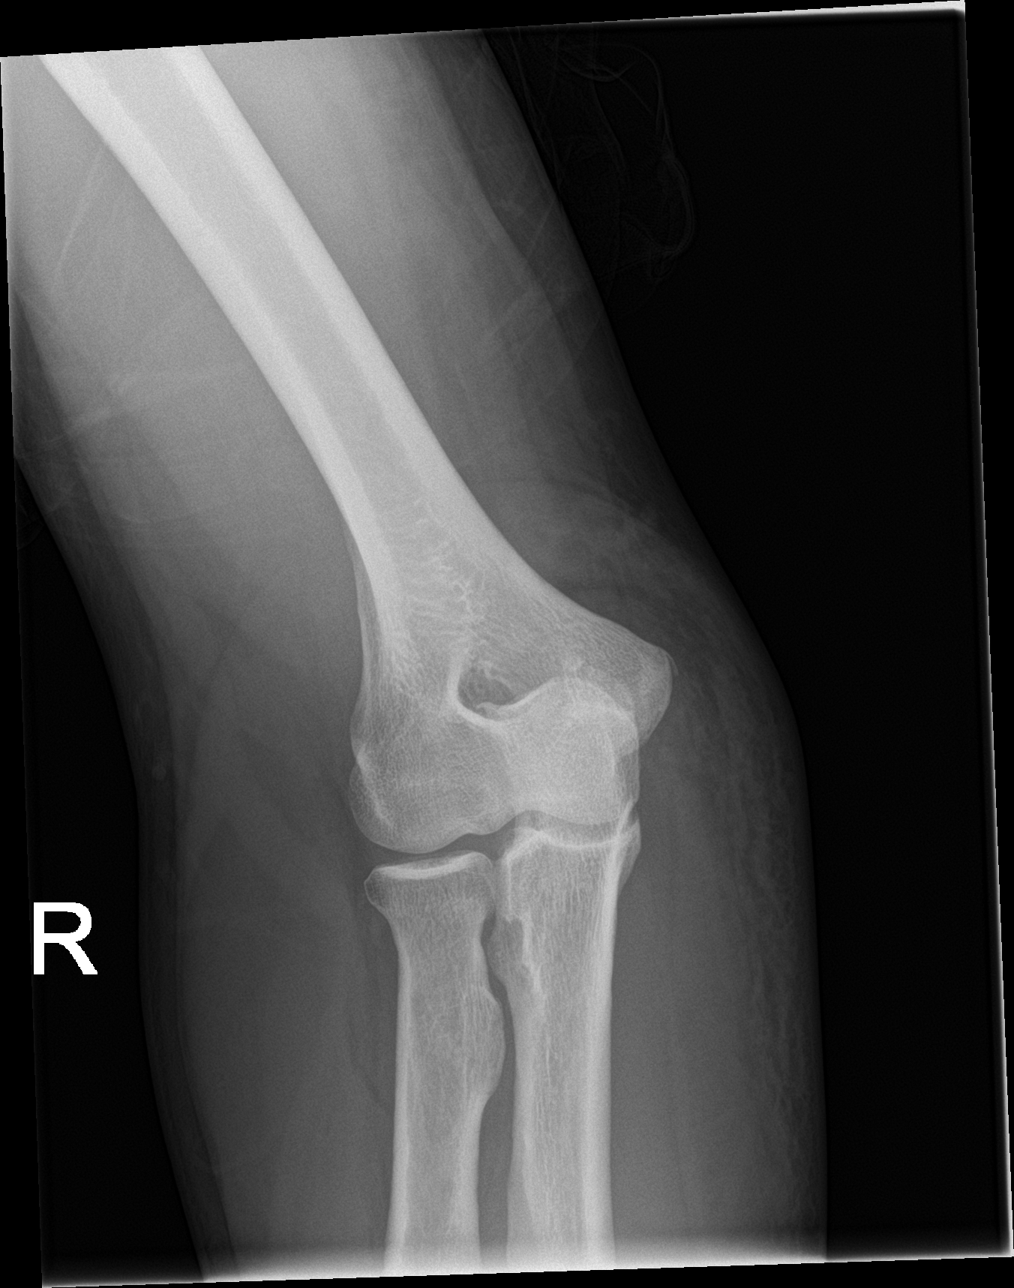

[elbow obl (2 of 2)]
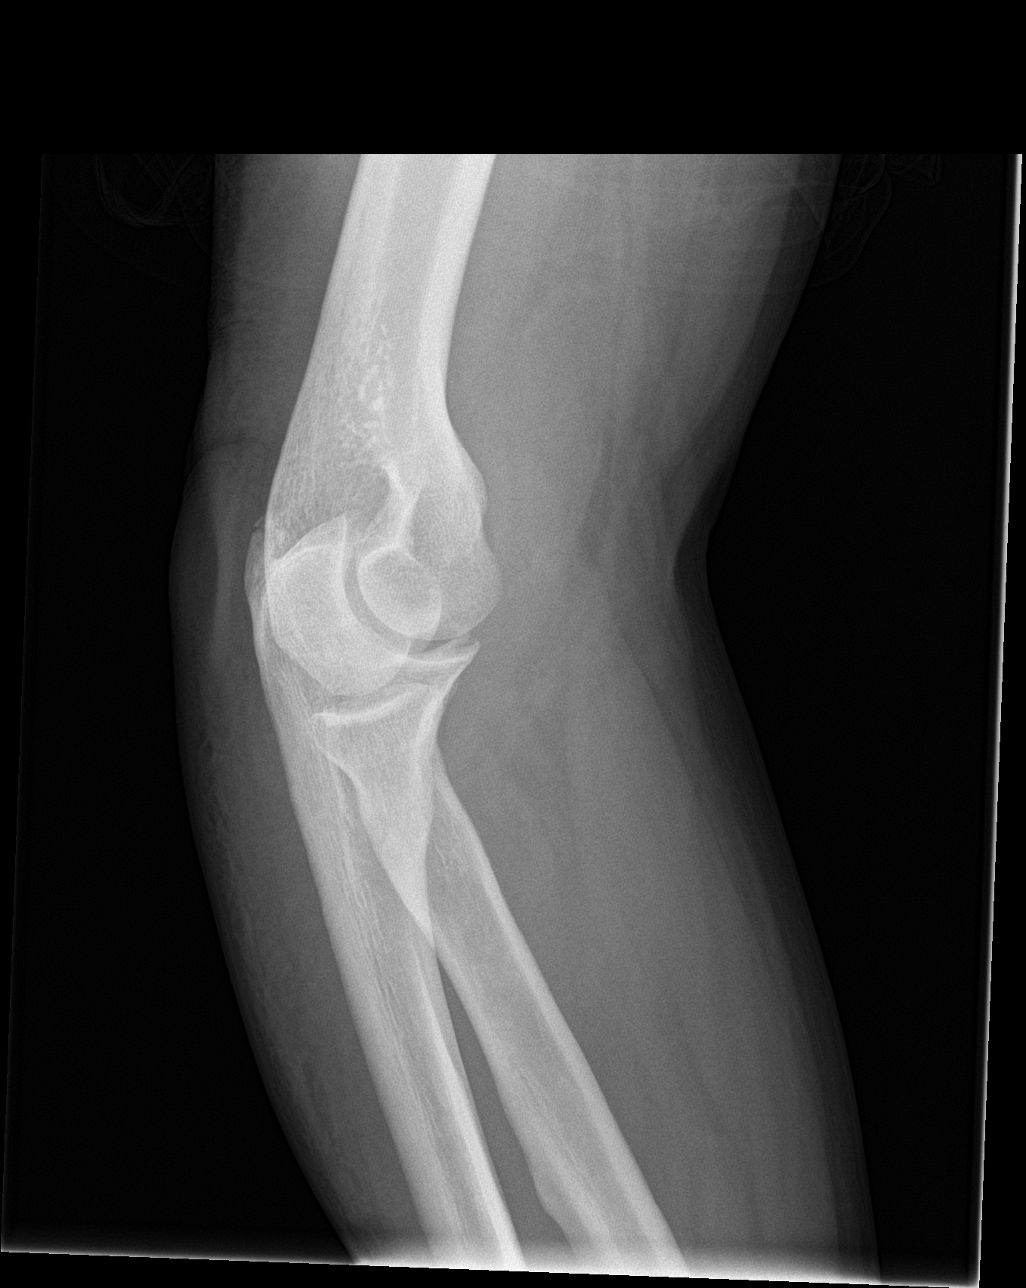

[elbow lat]
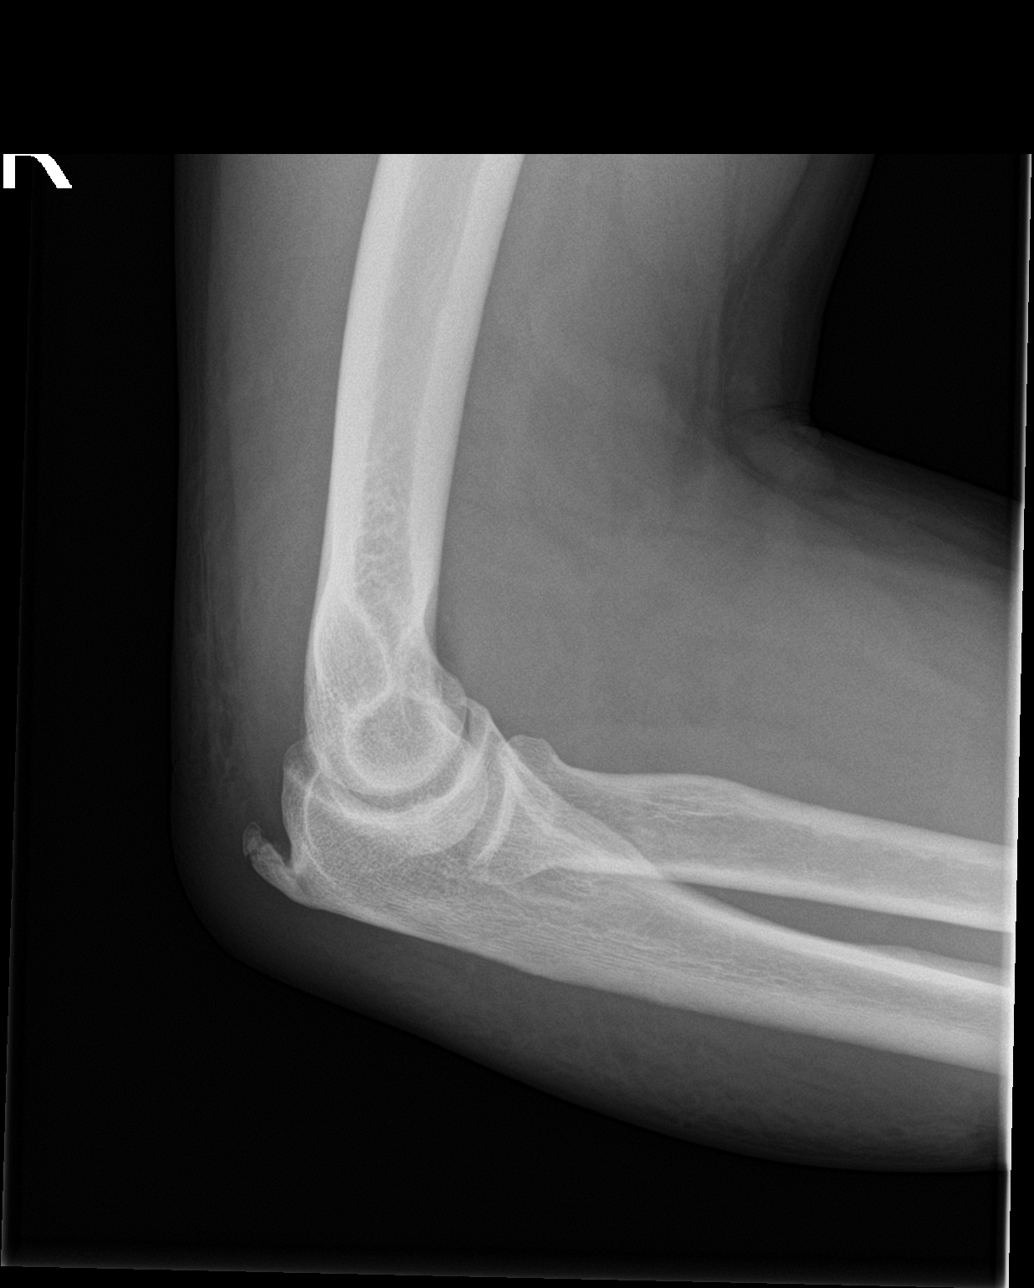

[4 of 4 positions shown; findings below may reference images not displayed]

FINDINGS: Osseous alignment is normal. Bone mineralization is normal. No acute
or suspicious osseous lesion. No destructive change to suggest
osteomyelitis. No appreciable joint effusion. Soft tissue edema
posterior to the elbow and proximal ulna. Incidental note made of
chronic spurring along the posterior margin of the olecranon.
IMPRESSION: 1. No acute osseous abnormality. No fracture or dislocation. No
evidence of osteomyelitis.
2. No evidence of joint effusion.
3. Soft tissue edema posterior to the right elbow and proximal ulna.

## 2020-03-11 ENCOUNTER — Other Ambulatory Visit: Payer: Self-pay

## 2020-03-11 ENCOUNTER — Ambulatory Visit
Admission: EM | Admit: 2020-03-11 | Discharge: 2020-03-11 | Disposition: A | Discharge status: Home / Self Care | Payer: Managed Care, Other (non HMO) | Attending: Emergency Medicine | Admitting: Emergency Medicine

## 2020-03-11 DIAGNOSIS — K529 Noninfective gastroenteritis and colitis, unspecified: Secondary | ICD-10-CM | POA: Diagnosis not present

## 2020-03-11 MED ORDER — ONDANSETRON 4 MG PO TBDP
4.0000 mg | ORAL_TABLET | Freq: Three times a day (TID) | ORAL | 0 refills | Status: DC | PRN
Start: 2020-03-11 — End: 2020-03-17

## 2020-03-11 NOTE — ED Triage Notes (Signed)
Pt states he has had abdominal pain and diarrhea since yesterday and believes it is due to the metformin. Pt states he has a doctor appointment in one week. Pt needs a work note. Pt is aox4 and ambulatory.
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
## 2020-03-11 NOTE — Discharge Instructions (Addendum)
Zofran for nausea. Keep an eye on blood sugar and follow up with PCP next week.

## 2020-03-17 ENCOUNTER — Encounter: Payer: Self-pay | Admitting: Internal Medicine

## 2020-03-17 ENCOUNTER — Ambulatory Visit (INDEPENDENT_AMBULATORY_CARE_PROVIDER_SITE_OTHER): Payer: Managed Care, Other (non HMO) | Admitting: Internal Medicine

## 2020-03-17 ENCOUNTER — Other Ambulatory Visit: Payer: Self-pay

## 2020-03-17 VITALS — BP 123/83 | HR 102 | Temp 97.3°F | Resp 17 | Wt 187.0 lb

## 2020-03-17 DIAGNOSIS — Z114 Encounter for screening for human immunodeficiency virus [HIV]: Secondary | ICD-10-CM

## 2020-03-17 DIAGNOSIS — Z1211 Encounter for screening for malignant neoplasm of colon: Secondary | ICD-10-CM

## 2020-03-17 DIAGNOSIS — E1165 Type 2 diabetes mellitus with hyperglycemia: Secondary | ICD-10-CM

## 2020-03-17 DIAGNOSIS — Z1159 Encounter for screening for other viral diseases: Secondary | ICD-10-CM | POA: Diagnosis not present

## 2020-03-17 LAB — POCT GLYCOSYLATED HEMOGLOBIN (HGB A1C): Hemoglobin A1C: 5.9 % — AB (ref 4.0–5.6)

## 2020-03-17 NOTE — Progress Notes (Signed)
  Subjective:    Derek Davidson - 54 y.o. male MRN [id number]  Date of birth: Jun 24, 1965  HPI  Derek Davidson is here for T2DM f/u.  Diabetes mellitus, Type 2 Disease Monitoring             Blood Sugar Ranges: Fasting - 110-130s              Polyuria: no              Visual problems: no   Urine Microalbumin 19 (Aug 2020)   Last A1C: 6.8 (Feb 2021)   Medications: Metformin 500 mg BID, Ozempic 0.5 mg weekly   Medication Compliance: no, was taking Metformin 500 mg daily and Ozempic 0.25 mg weekly   Medication Side Effects             Hypoglycemia: no     Health Maintenance:  Health Maintenance Due  Topic Date Due  . Hepatitis C Screening  Never done  . PNEUMOCOCCAL POLYSACCHARIDE VACCINE AGE 51-64 HIGH RISK  Never done  . COVID-19 Vaccine (1) Never done  . COLONOSCOPY  Never done  . HEMOGLOBIN A1C  01/19/2020  . URINE MICROALBUMIN  01/21/2020    -  reports that he has never smoked. He has never used smokeless tobacco. - Review of Systems: Per HPI. - Past Medical History: Patient Active Problem List   Diagnosis Date Noted  . Diabetes (HCC) 06/17/2016  . GERD 07/16/2007   - Medications: reviewed and updated   Objective:   Physical Exam BP 123/83   Pulse (!) 102   Temp (!) 97.3 F (36.3 C) (Temporal)   Resp 17   Wt 187 lb (84.8 kg)   SpO2 96%   BMI 26.83 kg/m  Physical Exam Constitutional:      General: He is not in acute distress.    Appearance: He is not diaphoretic.  Cardiovascular:     Rate and Rhythm: Normal rate.  Pulmonary:     Effort: Pulmonary effort is normal. No respiratory distress.  Musculoskeletal:        General: Normal range of motion.  Skin:    General: Skin is warm and dry.  Neurological:     Mental Status: He is alert and oriented to person, place, and time.  Psychiatric:        Mood and Affect: Affect normal.        Judgment: Judgment normal.            Assessment & Plan:   1. Type 2 diabetes mellitus with  hyperglycemia, without long-term current use of insulin (HCC) A1c shows optimal control with result of 5.9%. Was experiencing GI upset with low dose Metformin so will d/c. Continue Ozempic at 0.25 mg weekly due to well controlled fasting CBGs. If fasting CBGs become consistently >150, advised to increase to 0.5 mg weekly. Will monitor A1c in 3 months.  - HgB A1c - Microalbumin/Creatinine Ratio, Urine - Comprehensive metabolic panel  2. Need for hepatitis C screening test - HCV Ab w/Rflx to Verification  3. Screening for HIV (human immunodeficiency virus) - HIV antibody (with reflex)  4. Colon cancer screening - Ambulatory referral to Gastroenterology   Marcy Siren, D.O. 03/17/2020, 4:05 PM Primary Care at [facility]

## 2020-03-18 LAB — COMPREHENSIVE METABOLIC PANEL
ALT: 27 IU/L (ref 0–44)
AST: 16 IU/L (ref 0–40)
Albumin/Globulin Ratio: 1.9 (ref 1.2–2.2)
Albumin: 4.5 g/dL (ref 3.8–4.9)
Alkaline Phosphatase: 85 IU/L (ref 44–121)
BUN/Creatinine Ratio: 22 — ABNORMAL HIGH (ref 9–20)
BUN: 19 mg/dL (ref 6–24)
Bilirubin Total: 0.5 mg/dL (ref 0.0–1.2)
CO2: 24 mmol/L (ref 20–29)
Calcium: 9.9 mg/dL (ref 8.7–10.2)
Chloride: 98 mmol/L (ref 96–106)
Creatinine, Ser: 0.86 mg/dL (ref 0.76–1.27)
GFR calc Af Amer: 113 mL/min/{1.73_m2} (ref >59)
GFR calc non Af Amer: 98 mL/min/{1.73_m2} (ref >59)
Globulin, Total: 2.4 g/dL (ref 1.5–4.5)
Glucose: 153 mg/dL — ABNORMAL HIGH (ref 65–99)
Potassium: 4.4 mmol/L (ref 3.5–5.2)
Sodium: 138 mmol/L (ref 134–144)
Total Protein: 6.9 g/dL (ref 6.0–8.5)

## 2020-03-18 LAB — MICROALBUMIN / CREATININE URINE RATIO
Creatinine, Urine: 162 mg/dL
Microalb/Creat Ratio: 8 mg/g creat (ref 0–29)
Microalbumin, Urine: 13 ug/mL

## 2020-03-18 LAB — HIV ANTIBODY (ROUTINE TESTING W REFLEX): HIV Screen 4th Generation wRfx: NONREACTIVE

## 2020-03-18 LAB — HCV INTERPRETATION

## 2020-03-18 LAB — HCV AB W/RFLX TO VERIFICATION: HCV Ab: <0.1 s/co ratio (ref 0.0–0.9)

## 2020-04-01 ENCOUNTER — Other Ambulatory Visit: Payer: Self-pay | Admitting: Pharmacist

## 2020-04-01 ENCOUNTER — Telehealth: Payer: Self-pay

## 2020-04-01 MED ORDER — OZEMPIC (0.25 OR 0.5 MG/DOSE) 2 MG/1.5ML ~~LOC~~ SOPN: 0.2500 mg | PEN_INJECTOR | SUBCUTANEOUS | 2 refills | Status: DC

## 2020-04-01 NOTE — Telephone Encounter (Signed)
1) Medication(s) Requested (by name):OZEMPIC, 0.25 OR 0.5 MG/DOSE, 2 MG/1.5ML SOPN [[id number]]    2) Pharmacy of Choice: [facility] [address] ([address] [place] - [address]  [address] [place]) [place] [address]  Phone:  [phone] Fax:  [phone]   3) Special Requests:   Approved medications will be sent to the pharmacy, we will reach out if there is an issue.  Requests made after 3pm may not be addressed until the following business day!  If a patient is unsure of the name of the medication(s) please note and ask patient to call back when they are able to provide all info, do not send to responsible party until all information is available!

## 2020-04-01 NOTE — Telephone Encounter (Signed)
Rx sent 

## 2020-10-05 ENCOUNTER — Other Ambulatory Visit: Payer: Self-pay

## 2020-10-05 ENCOUNTER — Encounter: Payer: Self-pay | Admitting: Internal Medicine

## 2020-10-05 ENCOUNTER — Ambulatory Visit (INDEPENDENT_AMBULATORY_CARE_PROVIDER_SITE_OTHER): Payer: Managed Care, Other (non HMO) | Admitting: Internal Medicine

## 2020-10-05 VITALS — BP 131/84 | HR 97 | Temp 97.5°F | Resp 16 | Wt 198.0 lb

## 2020-10-05 DIAGNOSIS — E1165 Type 2 diabetes mellitus with hyperglycemia: Secondary | ICD-10-CM

## 2020-10-05 LAB — POCT GLYCOSYLATED HEMOGLOBIN (HGB A1C): HbA1c, POC (controlled diabetic range): 7.4 % — AB (ref 0.0–7.0)

## 2020-10-05 MED ORDER — OZEMPIC (0.25 OR 0.5 MG/DOSE) 2 MG/1.5ML ~~LOC~~ SOPN: 0.5000 mg | PEN_INJECTOR | SUBCUTANEOUS | 3 refills | Status: DC

## 2020-10-05 NOTE — Progress Notes (Signed)
  Subjective:    Derek Davidson - 55 y.o. male MRN [id number]  Date of birth: 1965/09/05  HPI  Derek Davidson is here for follow up.  Diabetes mellitus, Type 2 Disease Monitoring             Blood Sugar Ranges: Fasting - 150-180ss              Polyuria: no              Visual problems: no   Urine Microalbumin 8 (Oct 2021)   Last A1C: 5.9 (Oct 2021)   Medications: Ozempic 0.25 mg weekly  Medication Compliance: yes  Medication Side Effects             Hypoglycemia: no        Health Maintenance:  Health Maintenance Due  Topic Date Due  . PNEUMOCOCCAL POLYSACCHARIDE VACCINE AGE 56-64 HIGH RISK  Never done  . COLONOSCOPY (Pts 45-72yrs Insurance coverage will need to be confirmed)  Never done  . OPHTHALMOLOGY EXAM  06/10/2017  . FOOT EXAM  05/15/2020  . COVID-19 Vaccine (3 - Booster for Moderna series) 07/27/2020  . HEMOGLOBIN A1C  09/15/2020    -  reports that he has never smoked. He has never used smokeless tobacco. - Review of Systems: Per HPI. - Past Medical History: Patient Active Problem List   Diagnosis Date Noted  . Diabetes (HCC) 06/17/2016  . GERD 07/16/2007   - Medications: reviewed and updated   Objective:   Physical Exam BP 131/84 (BP Location: Right Arm, Patient Position: Sitting, Cuff Size: Large)   Pulse 97   Temp (!) 97.5 F (36.4 C)   Resp 16   Wt 198 lb (89.8 kg)   SpO2 96%   BMI 28.41 kg/m  Physical Exam Constitutional:      General: He is not in acute distress.    Appearance: He is not diaphoretic.  Cardiovascular:     Rate and Rhythm: Normal rate.  Pulmonary:     Effort: Pulmonary effort is normal. No respiratory distress.  Musculoskeletal:        General: Normal range of motion.  Skin:    General: Skin is warm and dry.  Neurological:     Mental Status: He is alert and oriented to person, place, and time.  Psychiatric:        Mood and Affect: Affect normal.        Judgment: Judgment normal.             Assessment & Plan:   1. Type 2 diabetes mellitus with hyperglycemia, without long-term current use of insulin (HCC) A1c worsened from 5.9 to 7.4. Will increase Ozempic dosage. Return in 3 months for monitoring.  Counseled on Diabetic diet, my plate method, 035 minutes of moderate intensity exercise/week Blood sugar logs with fasting goals of 80-120 mg/dl, random of less than 465 and in the event of sugars less than 60 mg/dl or greater than 681 mg/dl encouraged to notify the clinic. Advised on the need for annual eye exams, annual foot exams, Pneumonia vaccine. - HgB A1c - Semaglutide,0.25 or 0.5MG /DOS, (OZEMPIC, 0.25 OR 0.5 MG/DOSE,) 2 MG/1.5ML SOPN; Inject 0.5 mg into the skin once a week.  Dispense: 3 mL; Refill: 3    Marcy Siren, D.O. 10/05/2020, 9:52 AM Primary Care at [facility]

## 2020-10-05 NOTE — Progress Notes (Signed)
F/u DM 

## 2021-07-17 ENCOUNTER — Telehealth: Payer: Self-pay | Admitting: Internal Medicine

## 2021-07-17 NOTE — Telephone Encounter (Signed)
Patient left vm on 2/16 stating he received a call that he need to come in for a med refill appt on Ozempic. Per patient he was told that his prescription was written for a year and should not need to come in until May of this year. Patient stated he is out of town and can not come in and also need prescription sent to another pharmacy. Please advise.

## 2021-07-19 NOTE — Telephone Encounter (Signed)
Last OV was May, 2022. Based on protocol and A1C of 7.4, patient is due for a 3 month OV.  Will need an OV for additional medication.   Left detailed message on voicemail per DPR.

## 2021-11-16 ENCOUNTER — Encounter: Payer: Self-pay | Admitting: Family Medicine

## 2021-11-16 ENCOUNTER — Ambulatory Visit: Payer: Managed Care, Other (non HMO) | Admitting: Family Medicine

## 2021-11-16 VITALS — BP 125/82 | HR 112 | Temp 98.1°F | Resp 16 | Ht 69.0 in | Wt 196.0 lb

## 2021-11-16 DIAGNOSIS — E1165 Type 2 diabetes mellitus with hyperglycemia: Secondary | ICD-10-CM | POA: Diagnosis not present

## 2021-11-16 LAB — POCT GLYCOSYLATED HEMOGLOBIN (HGB A1C): Hemoglobin A1C: 7.2 % — AB (ref 4.0–5.6)

## 2021-11-16 MED ORDER — OZEMPIC (0.25 OR 0.5 MG/DOSE) 2 MG/1.5ML ~~LOC~~ SOPN: 0.5000 mg | PEN_INJECTOR | SUBCUTANEOUS | 0 refills | Status: DC
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
## 2021-12-05 ENCOUNTER — Other Ambulatory Visit: Payer: Self-pay | Admitting: Family Medicine

## 2021-12-05 DIAGNOSIS — E1165 Type 2 diabetes mellitus with hyperglycemia: Secondary | ICD-10-CM

## 2021-12-06 NOTE — Telephone Encounter (Signed)
Requested medication (s) are due for refill today: Yes  Requested medication (s) are on the active medication list: Yes  Last refill:  11/16/21  Future visit scheduled: Yes  Notes to clinic:  Unable to refill due to no protocol attached     Requested Prescriptions  Pending Prescriptions Disp Refills   OZEMPIC, 0.25 OR 0.5 MG/DOSE, 2 MG/3ML SOPN [Pharmacy Med Name: Ozempic (0.25 or 0.5 MG/DOSE) 2 MG/3ML Subcutaneous Solution Pen-injector] 3 mL 0    Sig: INJECT 0.5 MG SUBCUTANEOUSLY ONCE A WEEK     There is no refill protocol information for this order

## 2021-12-13 ENCOUNTER — Other Ambulatory Visit: Payer: Self-pay | Admitting: *Deleted

## 2021-12-13 DIAGNOSIS — E1165 Type 2 diabetes mellitus with hyperglycemia: Secondary | ICD-10-CM

## 2021-12-13 MED ORDER — OZEMPIC (0.25 OR 0.5 MG/DOSE) 2 MG/1.5ML ~~LOC~~ SOPN: 0.5000 mg | PEN_INJECTOR | SUBCUTANEOUS | 0 refills | Status: DC

## 2022-01-01 ENCOUNTER — Other Ambulatory Visit: Payer: Self-pay | Admitting: Family Medicine

## 2022-01-01 DIAGNOSIS — E1165 Type 2 diabetes mellitus with hyperglycemia: Secondary | ICD-10-CM

## 2022-01-02 NOTE — Telephone Encounter (Signed)
Schedule appointment?

## 2022-02-14 ENCOUNTER — Encounter: Payer: Self-pay | Admitting: Family Medicine

## 2022-02-14 ENCOUNTER — Ambulatory Visit: Payer: Managed Care, Other (non HMO) | Admitting: Family Medicine

## 2022-02-14 VITALS — BP 130/83 | HR 85 | Temp 98.1°F | Resp 16 | Ht 69.0 in | Wt 195.4 lb

## 2022-02-14 DIAGNOSIS — Z130 Encounter for screening for diseases of the blood and blood-forming organs and certain disorders involving the immune mechanism: Secondary | ICD-10-CM

## 2022-02-14 DIAGNOSIS — Z1211 Encounter for screening for malignant neoplasm of colon: Secondary | ICD-10-CM

## 2022-02-14 DIAGNOSIS — Z1329 Encounter for screening for other suspected endocrine disorder: Secondary | ICD-10-CM

## 2022-02-14 DIAGNOSIS — Z0000 Encounter for general adult medical examination without abnormal findings: Secondary | ICD-10-CM

## 2022-02-14 DIAGNOSIS — Z13228 Encounter for screening for other metabolic disorders: Secondary | ICD-10-CM

## 2022-02-14 DIAGNOSIS — Z13220 Encounter for screening for lipoid disorders: Secondary | ICD-10-CM | POA: Diagnosis not present
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
## 2022-02-15 LAB — HEMOGLOBIN A1C
Est. average glucose Bld gHb Est-mCnc: 166 mg/dL
Hgb A1c MFr Bld: 7.4 % — ABNORMAL HIGH (ref 4.8–5.6)

## 2022-02-15 LAB — CBC WITH DIFFERENTIAL/PLATELET
Basophils Absolute: 0 10*3/uL (ref 0.0–0.2)
Basos: 0 %
EOS (ABSOLUTE): 0.1 10*3/uL (ref 0.0–0.4)
Eos: 1 %
Hematocrit: 50.9 % (ref 37.5–51.0)
Hemoglobin: 17.5 g/dL (ref 13.0–17.7)
Immature Grans (Abs): 0.1 10*3/uL (ref 0.0–0.1)
Immature Granulocytes: 1 %
Lymphocytes Absolute: 2.1 10*3/uL (ref 0.7–3.1)
Lymphs: 30 %
MCH: 28.8 pg (ref 26.6–33.0)
MCHC: 34.4 g/dL (ref 31.5–35.7)
MCV: 84 fL (ref 79–97)
Monocytes Absolute: 0.7 10*3/uL (ref 0.1–0.9)
Monocytes: 10 %
Neutrophils Absolute: 4.1 10*3/uL (ref 1.4–7.0)
Neutrophils: 58 %
Platelets: 183 10*3/uL (ref 150–450)
RBC: 6.07 x10E6/uL — ABNORMAL HIGH (ref 4.14–5.80)
RDW: 12.5 % (ref 11.6–15.4)
WBC: 7 10*3/uL (ref 3.4–10.8)

## 2022-02-15 LAB — CMP14+EGFR
ALT: 40 IU/L (ref 0–44)
AST: 29 IU/L (ref 0–40)
Albumin/Globulin Ratio: 2.2 (ref 1.2–2.2)
Albumin: 4.6 g/dL (ref 3.8–4.9)
Alkaline Phosphatase: 73 IU/L (ref 44–121)
BUN/Creatinine Ratio: 15 (ref 9–20)
BUN: 14 mg/dL (ref 6–24)
Bilirubin Total: 0.6 mg/dL (ref 0.0–1.2)
CO2: 24 mmol/L (ref 20–29)
Calcium: 9.4 mg/dL (ref 8.7–10.2)
Chloride: 98 mmol/L (ref 96–106)
Creatinine, Ser: 0.95 mg/dL (ref 0.76–1.27)
Globulin, Total: 2.1 g/dL (ref 1.5–4.5)
Glucose: 194 mg/dL — ABNORMAL HIGH (ref 70–99)
Potassium: 4.5 mmol/L (ref 3.5–5.2)
Sodium: 137 mmol/L (ref 134–144)
Total Protein: 6.7 g/dL (ref 6.0–8.5)
eGFR: 93 mL/min/{1.73_m2} (ref >59)

## 2022-02-15 LAB — TSH: TSH: 1.15 u[IU]/mL (ref 0.450–4.500)

## 2022-02-15 LAB — LIPID PANEL
Chol/HDL Ratio: 3.4 ratio (ref 0.0–5.0)
Cholesterol, Total: 102 mg/dL (ref 100–199)
HDL: 30 mg/dL — ABNORMAL LOW (ref >39)
LDL Chol Calc (NIH): 54 mg/dL (ref 0–99)
Triglycerides: 88 mg/dL (ref 0–149)
VLDL Cholesterol Cal: 18 mg/dL (ref 5–40)

## 2022-03-08 LAB — COLOGUARD: COLOGUARD: NEGATIVE

## 2022-03-16 LAB — HM DIABETES EYE EXAM

## 2022-03-31 ENCOUNTER — Ambulatory Visit: Payer: Self-pay

## 2022-03-31 NOTE — Telephone Encounter (Signed)
  Chief Complaint: High blood glucose readings Symptoms: Frequent urination Frequency: past weeks Pertinent Negatives: Patient denies Dizziness. Disposition: [] ED /[] Urgent Care (no appt availability in office) / [x] Appointment(In office/virtual)/ []  [facility]/ [] Home Care/ [] Refused Recommended Disposition /[] [facility] [facility]/ [x]  Follow-up with PCP Additional Notes: Pt reports that in OV of 02/14/2022 pt was taken off of diabetes medications. Per note pt was to monitor blood sugars, diet and activity for 3 months. Pt is reporting blood sugars around 300, some a bit higher. Pt is not consistent with testing. Pt is urinating more frequent, but other wise feels ok. PT will start to regularly monitor blood sugars and keep a record.  Pt would like a call back for further instructions.

## 2022-03-31 NOTE — Telephone Encounter (Signed)
Reason for Disposition  [1] Blood glucose > 300 mg/dL (16.7 mmol/L) AND [2] two or more times in a row  Answer Assessment - Initial Assessment Questions 1. BLOOD GLUCOSE: "What is your blood glucose level?"      Unsure about right now but has been in the 300's first thing in the morning 2. ONSET: "When did you check the blood glucose?"     A few days days 3. USUAL RANGE: "What is your glucose level usually?" (e.g., usual fasting morning value, usual evening value)     300 range 4. KETONES: "Do you check for ketones (urine or blood test strips)?" If Yes, ask: "What does the test show now?"       5. TYPE 1 or 2:  "Do you know what type of diabetes you have?"  (e.g., Type 1, Type 2, Gestational; doesn't know)      Type 2 6. INSULIN: "Do you take insulin?" "What type of insulin(s) do you use? What is the mode of delivery? (syringe, pen; injection or pump)?"       7. DIABETES PILLS: "Do you take any pills for your diabetes?" If Yes, ask: "Have you missed taking any pills recently?"      8. OTHER SYMPTOMS: "Do you have any symptoms?" (e.g., fever, frequent urination, difficulty breathing, dizziness, weakness, vomiting)     Frequent urination,  9. PREGNANCY: "Is there any chance you are pregnant?" "When was your last menstrual period?"  Protocols used: Diabetes - High Blood Sugar-A-AH

## 2022-04-01 NOTE — Progress Notes (Unsigned)
Patient ID: Derek Davidson, male    DOB: [birth year]  MRN: [id number]  CC: No chief complaint on file.   Subjective: Derek Davidson is a 56 y.o. male who presents for  His concerns today include:   High blood glucose readings  02/21/2022 result note from [facility]  Slightly increased A1c - evaluate diet and activity options and recheck in 3 months   Taking Ozempic?  03/31/2022 per triage RN note:   Chief Complaint: High blood glucose readings Symptoms: Frequent urination Frequency: past weeks Pertinent Negatives: Patient denies Dizziness. Disposition: [] ED /[] Urgent Care (no appt availability in office) / [x] Appointment(In office/virtual)/ []  [facility]/ [] Home Care/ [] Refused Recommended Disposition /[] [facility] [facility]/ [x]  Follow-up with PCP Additional Notes: Pt reports that in OV of 02/14/2022 pt was taken off of diabetes medications. Per note pt was to monitor blood sugars, diet and activity for 3 months. Pt is reporting blood sugars around 300, some a bit higher. Pt is not consistent with testing. Pt is urinating more frequent, but other wise feels ok. PT will start to regularly monitor blood sugars and keep a record.  Pt would like a call back for further instructions      Reason for Disposition  [1] Blood glucose > 300 mg/dL (40.9 mmol/L) AND [8] two or more times in a row  Answer Assessment - Initial Assessment Questions 1. BLOOD GLUCOSE: "What is your blood glucose level?"      Unsure about right now but has been in the 300's first thing in the morning 2. ONSET: "When did you check the blood glucose?"     A few days days 3. USUAL RANGE: "What is your glucose level usually?" (e.g., usual fasting morning value, usual evening value)     300 range 4. KETONES: "Do you check for ketones (urine or blood test strips)?" If Yes, ask: "What does the test show now?"       5. TYPE 1 or 2:  "Do you know what type of diabetes you have?"  (e.g., Type 1, Type  2, Gestational; doesn't know)      Type 2 6. INSULIN: "Do you take insulin?" "What type of insulin(s) do you use? What is the mode of delivery? (syringe, pen; injection or pump)?"       7. DIABETES PILLS: "Do you take any pills for your diabetes?" If Yes, ask: "Have you missed taking any pills recently?"      8. OTHER SYMPTOMS: "Do you have any symptoms?" (e.g., fever, frequent urination, difficulty breathing, dizziness, weakness, vomiting)     Frequent urination,  9. PREGNANCY: "Is there any chance you are pregnant?" "When was your last menstrual period?"  Protocols used: Diabetes - High Blood Sugar-A-AH    Patient Active Problem List   Diagnosis Date Noted   Diabetes (HCC) 06/17/2016   GERD 07/16/2007     Current Outpatient Medications on File Prior to Visit  Medication Sig Dispense Refill   aspirin 81 MG chewable tablet Chew 81 mg by mouth daily.     Multiple Vitamin (MULTIVITAMIN) tablet Take 1 tablet by mouth daily.     Omega-3 Fatty Acids (FISH OIL) 1000 MG CAPS Take by mouth.     OZEMPIC, 0.25 OR 0.5 MG/DOSE, 2 MG/3ML SOPN Inject 0.5 mg into the skin once a week.     Semaglutide,0.25 or 0.5MG /DOS, (OZEMPIC, 0.25 OR 0.5 MG/DOSE,) 2 MG/1.5ML SOPN Inject 0.5 mg into the skin once a week. (Patient not taking: Reported on  02/14/2022) 3 mL 0   No current facility-administered medications on file prior to visit.    Allergies  Allergen Reactions   Hydrocodone     REACTION: Given at Dentist, Rash developed    Social History   Socioeconomic History   Marital status: Married    Spouse name: Not on file   Number of children: Not on file   Years of education: Not on file   Highest education level: Not on file  Occupational History   Not on file  Tobacco Use   Smoking status: Never   Smokeless tobacco: Never  Vaping Use   Vaping Use: Never used  Substance and Sexual Activity   Alcohol use: Never   Drug use: Never   Sexual activity: Not Currently    Birth  control/protection: Condom    Comment: ocationally  Other Topics Concern   Not on file  Social History Narrative   Married to Freddrick March, Internal Medicine Office   Occupation: [employer], 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, labor at work   Social Determinants of Corporate investment banker Strain: Not on [employer] Insecurity: Not on file  Transportation Needs: Not on file  Physical Activity: Not on file  Stress: Not on file  Social Connections: Not on file  Intimate Partner Violence: Not on file    Family History  Problem Relation Age of Onset   Coronary artery disease Mother        CHF 2' AMI, died 02-Nov-2006   Diabetes Father        accident in 11/12   Cancer Maternal Grandmother        colon cancer   Diabetes Sister     Past Surgical History:  Procedure Laterality Date   NO PAST SURGERIES      ROS: Review of Systems Negative except as stated above  PHYSICAL EXAM: There were no vitals taken for this visit.  Physical Exam  {male adult master:310786} {male adult master:310785}     Latest Ref Rng & Units 02/14/2022   10:43 AM 03/17/2020    4:25 PM 01/21/2019   10:39 AM  CMP  Glucose 70 - 99 mg/dL 161  096  045   BUN 6 - 24 mg/dL 14  19  16    Creatinine 0.76 - 1.27 mg/dL 4.09  8.11  9.14   Sodium 134 - 144 mmol/L 137  138  137   Potassium 3.5 - 5.2 mmol/L 4.5  4.4  4.1   Chloride 96 - 106 mmol/L 98  98  101   CO2 20 - 29 mmol/L 24  24  22    Calcium 8.7 - 10.2 mg/dL 9.4  9.9  9.1   Total Protein 6.0 - 8.5 g/dL 6.7  6.9  6.9   Total Bilirubin 0.0 - 1.2 mg/dL 0.6  0.5  0.7   Alkaline Phos 44 - 121 IU/L 73  85  95   AST 0 - 40 IU/L 29  16  20    ALT 0 - 44 IU/L 40  27  36    Lipid Panel     Component Value Date/Time   CHOL 102 02/14/2022 1043   TRIG 88 02/14/2022 1043   HDL 30 (L) 02/14/2022 1043   CHOLHDL 3.4 02/14/2022 1043   CHOLHDL 3.1 01/27/2012 1632   VLDL 22 01/27/2012 1632   LDLCALC  54 02/14/2022 1043    CBC  Component Value Date/Time   WBC 7.0 02/14/2022 1043   WBC 16.2 (H) 08/06/2017 1134   RBC 6.07 (H) 02/14/2022 1043   RBC 5.56 08/06/2017 1134   HGB 17.5 02/14/2022 1043   HCT 50.9 02/14/2022 1043   PLT 183 02/14/2022 1043   MCV 84 02/14/2022 1043   MCH 28.8 02/14/2022 1043   MCH 29.9 08/06/2017 1134   MCHC 34.4 02/14/2022 1043   MCHC 36.0 08/06/2017 1134   RDW 12.5 02/14/2022 1043   LYMPHSABS 2.1 02/14/2022 1043   MONOABS 1.8 (H) 08/06/2017 1134   EOSABS 0.1 02/14/2022 1043   BASOSABS 0.0 02/14/2022 1043    ASSESSMENT AND PLAN:  There are no diagnoses linked to this encounter.   Patient was given the opportunity to ask questions.  Patient verbalized understanding of the plan and was able to repeat key elements of the plan. Patient was given clear instructions to go to Emergency Department or return to medical center if symptoms don't improve, worsen, or new problems develop.The patient verbalized understanding.   No orders of the defined types were placed in this encounter.    Requested Prescriptions    No prescriptions requested or ordered in this encounter    No follow-ups on file.  Rema Fendt, NP

## 2022-04-04 ENCOUNTER — Encounter: Payer: Self-pay | Admitting: Family Medicine

## 2022-04-04 ENCOUNTER — Ambulatory Visit: Payer: Managed Care, Other (non HMO) | Admitting: Family Medicine

## 2022-04-04 VITALS — BP 143/91 | HR 106 | Temp 98.1°F | Resp 16 | Wt 193.8 lb

## 2022-04-04 DIAGNOSIS — E1165 Type 2 diabetes mellitus with hyperglycemia: Secondary | ICD-10-CM | POA: Diagnosis not present

## 2022-04-04 DIAGNOSIS — I10 Essential (primary) hypertension: Secondary | ICD-10-CM

## 2022-04-04 MED ORDER — OZEMPIC (0.25 OR 0.5 MG/DOSE) 2 MG/1.5ML ~~LOC~~ SOPN: 0.5000 mg | PEN_INJECTOR | SUBCUTANEOUS | 0 refills | Status: DC

## 2022-04-04 MED ORDER — LISINOPRIL 10 MG PO TABS: 10.0000 mg | ORAL_TABLET | Freq: Every day | ORAL | 1 refills | Status: DC

## 2022-04-06 ENCOUNTER — Telehealth: Payer: Self-pay | Admitting: *Deleted

## 2022-04-06 NOTE — Telephone Encounter (Signed)
Semaglutide,0.25 or 0.5MG /DOS, (OZEMPIC, 0.25 OR 0.5 MG/DOSE,) 2 MG/1.5ML SOPN   Has been approve thorough 04/06/2023 by [place] [facility]

## 2022-04-07 ENCOUNTER — Encounter: Payer: Self-pay | Admitting: Family Medicine
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
## 2022-04-13 ENCOUNTER — Telehealth: Payer: Self-pay | Admitting: Family Medicine

## 2022-04-13 ENCOUNTER — Other Ambulatory Visit: Payer: Self-pay | Admitting: *Deleted

## 2022-04-13 DIAGNOSIS — E1165 Type 2 diabetes mellitus with hyperglycemia: Secondary | ICD-10-CM

## 2022-04-13 MED ORDER — OZEMPIC (0.25 OR 0.5 MG/DOSE) 2 MG/1.5ML ~~LOC~~ SOPN: 0.5000 mg | PEN_INJECTOR | SUBCUTANEOUS | 0 refills | Status: DC

## 2022-04-13 NOTE — Telephone Encounter (Signed)
Refill was sent to pharmacy.

## 2022-04-13 NOTE — Telephone Encounter (Signed)
Semaglutide,0.25 or 0.5MG /DOS, (OZEMPIC, 0.25 OR 0.5 MG/DOSE,) 2 MG/1.5ML SOPN    Has been approve thorough 04/06/2023 by [place] [facility]   On [facility] [address]

## 2022-07-04 ENCOUNTER — Ambulatory Visit: Payer: Managed Care, Other (non HMO) | Admitting: Family Medicine

## 2022-07-04 ENCOUNTER — Encounter: Payer: Self-pay | Admitting: Family Medicine

## 2022-07-04 VITALS — BP 136/89 | HR 88 | Temp 98.1°F | Resp 16 | Wt 199.2 lb

## 2022-07-04 DIAGNOSIS — I10 Essential (primary) hypertension: Secondary | ICD-10-CM | POA: Diagnosis not present

## 2022-07-04 DIAGNOSIS — E1165 Type 2 diabetes mellitus with hyperglycemia: Secondary | ICD-10-CM

## 2022-07-04 LAB — POCT GLYCOSYLATED HEMOGLOBIN (HGB A1C): Hemoglobin A1C: 8.8 % — AB (ref 4.0–5.6)

## 2022-07-04 MED ORDER — OZEMPIC (0.25 OR 0.5 MG/DOSE) 2 MG/1.5ML ~~LOC~~ SOPN: 0.5000 mg | PEN_INJECTOR | SUBCUTANEOUS | 0 refills | Status: DC
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
## 2022-07-04 NOTE — Progress Notes (Signed)
Patient is here for their 3 month follow-up Patient has no concerns today Care gaps have been discussed with patient  

## 2022-10-03 ENCOUNTER — Ambulatory Visit: Payer: Managed Care, Other (non HMO) | Admitting: Family Medicine

## 2022-10-03 ENCOUNTER — Encounter: Payer: Self-pay | Admitting: Family Medicine

## 2022-10-03 VITALS — BP 130/82 | HR 91 | Temp 97.7°F | Resp 16 | Wt 196.2 lb

## 2022-10-03 DIAGNOSIS — Z7985 Long-term (current) use of injectable non-insulin antidiabetic drugs: Secondary | ICD-10-CM

## 2022-10-03 DIAGNOSIS — E1165 Type 2 diabetes mellitus with hyperglycemia: Secondary | ICD-10-CM | POA: Diagnosis not present

## 2022-10-03 DIAGNOSIS — K219 Gastro-esophageal reflux disease without esophagitis: Secondary | ICD-10-CM | POA: Diagnosis not present

## 2022-10-03 DIAGNOSIS — I10 Essential (primary) hypertension: Secondary | ICD-10-CM | POA: Diagnosis not present

## 2022-10-03 LAB — POCT GLYCOSYLATED HEMOGLOBIN (HGB A1C): Hemoglobin A1C: 8 % — AB (ref 4.0–5.6)

## 2022-10-03 MED ORDER — OZEMPIC (0.25 OR 0.5 MG/DOSE) 2 MG/1.5ML ~~LOC~~ SOPN: 0.5000 mg | PEN_INJECTOR | SUBCUTANEOUS | 0 refills | Status: DC

## 2022-10-03 NOTE — Progress Notes (Unsigned)
Patient is here for their 3 month follow-up Patient has no concerns today Care gaps have been discussed with patient  
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
## 2022-10-04 LAB — MICROALBUMIN / CREATININE URINE RATIO
Creatinine, Urine: 124.3 mg/dL
Microalb/Creat Ratio: 8 mg/g{creat} (ref 0–29)
Microalbumin, Urine: 10.1 ug/mL

## 2022-10-05 ENCOUNTER — Encounter: Payer: Self-pay | Admitting: Family Medicine

## 2022-10-05 ENCOUNTER — Other Ambulatory Visit: Payer: Self-pay | Admitting: Family Medicine

## 2022-10-05 NOTE — Telephone Encounter (Signed)
Requested Prescriptions  Pending Prescriptions Disp Refills   lisinopril (ZESTRIL) 10 MG tablet [Pharmacy Med Name: Lisinopril 10 MG Oral Tablet] 90 tablet 0    Sig: Take 1 tablet by mouth once daily     Cardiovascular:  ACE Inhibitors Failed - 10/05/2022  6:52 AM      Failed - Cr in normal range and within 180 days    Creat  Date Value Ref Range Status  01/27/2012 0.98 0.50 - 1.35 mg/dL Final   Creatinine, Ser  Date Value Ref Range Status  02/14/2022 0.95 0.76 - 1.27 mg/dL Final         Failed - K in normal range and within 180 days    Potassium  Date Value Ref Range Status  02/14/2022 4.5 3.5 - 5.2 mmol/L Final         Passed - Patient is not pregnant      Passed - Last BP in normal range    BP Readings from Last 1 Encounters:  10/03/22 130/82         Passed - Valid encounter within last 6 months    Recent Outpatient Visits           2 days ago Type 2 diabetes mellitus with hyperglycemia, without long-term current use of insulin (HCC)   [place] [facility], MD   3 months ago Essential hypertension   [place] [facility], MD   6 months ago Type 2 diabetes mellitus with hyperglycemia, without long-term current use of insulin [facility])   [place] [facility], MD   7 months ago Annual physical exam   [place] [facility], Lauris Poag, MD   10 months ago Type 2 diabetes mellitus with hyperglycemia, without long-term current use of insulin [facility])   [place] [facility], MD       Future Appointments             In 1 week Lois Huxley, Cornelius Moras, RPH-CPP [facility]   In 4 months Georganna Skeans, MD [facility]

## 2022-10-10 ENCOUNTER — Ambulatory Visit: Payer: Managed Care, Other (non HMO) | Admitting: Pharmacist

## 2022-10-17 ENCOUNTER — Ambulatory Visit: Payer: Managed Care, Other (non HMO) | Attending: Family Medicine | Admitting: Pharmacist

## 2022-10-17 DIAGNOSIS — E1165 Type 2 diabetes mellitus with hyperglycemia: Secondary | ICD-10-CM | POA: Diagnosis not present

## 2022-10-17 DIAGNOSIS — Z7985 Long-term (current) use of injectable non-insulin antidiabetic drugs: Secondary | ICD-10-CM | POA: Diagnosis not present

## 2022-10-17 MED ORDER — ACCU-CHEK GUIDE VI STRP: ORAL_STRIP | 2 refills | Status: DC

## 2022-10-17 MED ORDER — ACCU-CHEK GUIDE W/DEVICE KIT: PACK | 0 refills | Status: DC

## 2022-10-17 MED ORDER — SEMAGLUTIDE (1 MG/DOSE) 4 MG/3ML ~~LOC~~ SOPN: 1.0000 mg | PEN_INJECTOR | SUBCUTANEOUS | 1 refills | Status: DC

## 2022-10-17 MED ORDER — ACCU-CHEK SOFTCLIX LANCETS MISC: 2 refills | Status: DC

## 2022-10-17 NOTE — Progress Notes (Unsigned)
    S:    PCP: Dr. Andrey Campanile  57 y.o. male who presents for diabetes evaluation, education, and management.  PMH is significant for T2DM and GERD. Patient was referred and last seen by Primary Care Provider, Dr. Andrey Campanile, on 10/03/2022.   At last visit, A1c was 8.0%, down from 8.8% in February 2024. No medication changes were made.    Today, patient arrives in good spirits and presents without any assistance. Has been on Ozempic for a couple of years. Reports elevated A1c in the setting of dietary indiscretions. Denies GI upset with Ozempic. He does not check his blood sugar due to meter malfunction.   Patient reports Diabetes was diagnosed 6-7 years ago.   Family/Social History:  -CKD: none -stroke/MI: no personal, mom had MI  Current diabetes medications include: Ozempic 0.5 mg weekly (Sundays) Current hypertension medications include: lisinopril 10 mg daily  Patient reports adherence to taking all medications as prescribed.   Insurance coverage: Cigna  Patient denies hypoglycemic events.  Reported home fasting blood sugars: not checking  Reported 2 hour post-meal/random blood sugars: 180s.  Patient denies nocturia (nighttime urination).  Patient denies neuropathy (nerve pain). Patient denies visual changes. Patient reports self foot exams.   Patient reported dietary habits: eats 3 meals a day -usually eats 1 big meal (lunch) and other are smaller -lunch: grilled chicken or fish and stir fry veggies -pasta and rice occasionally -beverages: apple and orange juice   Patient-reported exercise habits:  -drives a fork lift at work   O:  ROS  Physical Exam  7 day average blood glucose: no meter with him, meter batteries are dead    Lab Results  Component Value Date   HGBA1C 8.0 (A) 10/03/2022   There were no vitals filed for this visit.  Lipid Panel     Component Value Date/Time   CHOL 102 02/14/2022 1043   TRIG 88 02/14/2022 1043   HDL 30 (L) 02/14/2022 1043    CHOLHDL 3.4 02/14/2022 1043   CHOLHDL 3.1 01/27/2012 1632   VLDL 22 01/27/2012 1632   LDLCALC 54 02/14/2022 1043    Clinical Atherosclerotic Cardiovascular Disease (ASCVD): No  The ASCVD Risk score (Arnett DK, et al., 2019) failed to calculate for the following reasons:   The valid total cholesterol range is 130 to 320 mg/dL   A/P: Diabetes longstanding, currently slightly above goal based on A1c. Patient is able to verbalize appropriate hypoglycemia management plan. Medication adherence appears appropriate. Control is suboptimal due to dietary indiscretions. -Increased dose of GLP-1 Ozempic (semaglutide) to 1 mg.  -Will send in prescription for meter. -Patient educated on purpose, proper use, and potential adverse effects of Ozempic.  -Extensively discussed pathophysiology of diabetes, recommended lifestyle interventions, dietary effects on blood sugar control.  -Counseled on s/sx of and management of hypoglycemia.  -Next A1c anticipated August 2024.   Written patient instructions provided. Patient verbalized understanding of treatment plan.  Total time in face to face counseling 30 minutes.    Follow-up:  Pharmacist 1 month. PCP clinic visit in 02/06/23.   Valeda Malm, Pharm.D. PGY-2 Ambulatory Care Pharmacy Resident

## 2022-11-20 NOTE — Progress Notes (Unsigned)
S:     No chief complaint on file.  57 y.o. male who presents for diabetes evaluation, education, and management.  PMH is significant for T2DM, GERD.  Patient was referred and last seen by Primary Care Provider, Dr. Andrey Campanile, on 10/03/2022.  At last visit, A1c was 8% down from 8.8%. No medication changes were made. Last seen by pharmacy clinic on 10/17/2022. Ozempic increased from 0.5 mg to 1 mg weekly.  Today, patient arrives in good spirits and presents without any assistance. Reports adherence with Ozempic. Endorses GI upset ~once a week but does not endorse any NV or abdominal pain. Has taken 3 doses of the Ozempic 1 mg weekly on Sundays. Reports checking his blood sugars. Per his meter, this morning, blood sugar 133 mg/dL. Unable to see other data with his meter, of note his meter is on recall for inaccuracy data.  Patient reports diabetes was diagnosed 6-7 years ago.   Current diabetes medications include: Ozempic 1 mg weekly Current hypertension medications include: Lisinopril 10 mg daily  Patient denies hypoglycemic events.  Reported home fasting blood sugars: No data from meter.  Reported 2 hour post-meal/random blood sugars: No data from meter.  Patient denies nocturia (nighttime urination).  Patient denies neuropathy (nerve pain). Patient denies visual changes. Patient reports self foot exams.   Patient reported dietary and exercise habits: No changes from last visit.  Family/Social History: -Fhx: DM, CAD, cancer -Tobacco: never smoker  Insurance coverage: Cigna  O:   ROS  Physical Exam  7 day average blood glucose: 206 mg/dL  Lab Results  Component Value Date   HGBA1C 8.0 (A) 10/03/2022   There were no vitals filed for this visit.  Lipid Panel     Component Value Date/Time   CHOL 102 02/14/2022 1043   TRIG 88 02/14/2022 1043   HDL 30 (L) 02/14/2022 1043   CHOLHDL 3.4 02/14/2022 1043   CHOLHDL 3.1 01/27/2012 1632   VLDL 22 01/27/2012 1632   LDLCALC  54 02/14/2022 1043    Clinical Atherosclerotic Cardiovascular Disease (ASCVD): No  The ASCVD Risk score (Arnett DK, et al., 2019) failed to calculate for the following reasons:   The valid total cholesterol range is 130 to 320 mg/dL   Patient is participating in a Managed Medicaid Plan: No  A/P: Diabetes longstanding currently uncontrolled, close to goal based on A1c. Patient is able to verbalize appropriate hypoglycemia management plan. Medication adherence appears appropriate. Control is suboptimal due to accurate meter readings and dietary indiscretions. On Sunday, patient will use Ozempic 1 mg, for a total of 4 weekly doses. Then will increase to 2 mg, of note will do two doses of 1 mg to utilize the unused 4 mg/3 mL pens.  -Increased dose of Ozempic (semaglutide) 2 mg weekly. -Sent in new prescription for Accu check supplies. -Patient educated on purpose, proper use, and potential adverse effects of Ozempic.  -Extensively discussed pathophysiology of diabetes, recommended lifestyle interventions, dietary effects on blood sugar control.  -Counseled on s/sx of and management of hypoglycemia.  -Next A1c anticipated August 2024.   Hypertension longstanding currently close to goal based on last visit BP, 130/82. Blood pressure goal of <130/80 mmHg. Medication adherence appropriate. -Continued lisinopril 10 mg daily.  Written patient instructions provided. Patient verbalized understanding of treatment plan.  Total time in face to face counseling 30 minutes.    Follow-up: 4 weeks Pharmacist in 1 month.   Patient seen with  Alesia Banda, PharmD Candidate [employer] Class  of 2025  Butch Penny, PharmD, [place], CPP Clinical Pharmacist [facility] [phone]

## 2022-11-21 ENCOUNTER — Ambulatory Visit: Payer: Managed Care, Other (non HMO) | Attending: Family Medicine | Admitting: Pharmacist

## 2022-11-21 ENCOUNTER — Encounter: Payer: Self-pay | Admitting: Pharmacist

## 2022-11-21 ENCOUNTER — Other Ambulatory Visit: Payer: Self-pay | Admitting: Pharmacist

## 2022-11-21 DIAGNOSIS — Z7985 Long-term (current) use of injectable non-insulin antidiabetic drugs: Secondary | ICD-10-CM

## 2022-11-21 DIAGNOSIS — E1165 Type 2 diabetes mellitus with hyperglycemia: Secondary | ICD-10-CM | POA: Diagnosis not present

## 2022-11-21 MED ORDER — SEMAGLUTIDE (2 MG/DOSE) 8 MG/3ML ~~LOC~~ SOPN: 2.0000 mg | PEN_INJECTOR | SUBCUTANEOUS | 1 refills | Status: DC

## 2022-11-21 MED ORDER — ACCU-CHEK GUIDE VI STRP
ORAL_STRIP | 2 refills | Status: AC
Start: 2022-11-21 — End: ?

## 2022-11-21 MED ORDER — ACCU-CHEK SOFTCLIX LANCETS MISC
2 refills | Status: DC
Start: 2022-11-21 — End: 2023-05-09

## 2022-11-21 MED ORDER — ACCU-CHEK GUIDE W/DEVICE KIT: PACK | 0 refills | Status: AC

## 2022-12-19 ENCOUNTER — Ambulatory Visit: Payer: Self-pay | Admitting: Pharmacist

## 2022-12-25 NOTE — Progress Notes (Unsigned)
    S:     No chief complaint on file.  57 y.o. male who presents for diabetes evaluation, education, and management.  PMH is significant for T2DM, GERD.  Patient was referred and last seen by Primary Care Provider, Dr. Andrey Campanile, on 10/03/2022.  At last visit, T2DM was uncontrolled based on average FBG of 206 mg/dL and I9J 8.0 (06/06/82). Ozempic was increased to 2 mg weekly and new RX for Accucheck supplies was sent. Last seen by pharmacy clinic on 11/21/2022.  Today, patient arrives in good spirits and presents without any assistance. Reports adherence with Ozempic. Endorses GI upset ~once a week but denies any N/V or abdominal pain. States GI upset is mild and tolerable. Reports checking BG once daily in the morning with breakfast with new meter obtained since last visit.  Patient reports diabetes was diagnosed 6-7 years ago.   Current diabetes medications include: Ozempic 2 mg weekly on Sundays Current hypertension medications include: Lisinopril 10 mg daily  Patient denies hypoglycemic events.  Reported home fasting blood sugars: No data from meter.  Reported 2 hour post-meal/random blood sugars: No data from meter.  Patient denies nocturia (nighttime urination).  Patient denies neuropathy (nerve pain). Patient denies visual changes. Patient reports self foot exams.   Patient reported dietary and exercise habits: No changes from last visit.  Family/Social History: -Fhx: DM, CAD, cancer -Tobacco: never smoker  Insurance coverage: Cigna  O:   ROS  Physical Exam  7 day average blood glucose: 164 mg/dL  Lab Results  Component Value Date   HGBA1C 7.0 12/26/2022   Vitals:   12/26/22 0925  BP: 121/73  Pulse: 90    Lipid Panel     Component Value Date/Time   CHOL 102 02/14/2022 1043   TRIG 88 02/14/2022 1043   HDL 30 (L) 02/14/2022 1043   CHOLHDL 3.4 02/14/2022 1043   CHOLHDL 3.1 01/27/2012 1632   VLDL 22 01/27/2012 1632   LDLCALC 54 02/14/2022 1043    Clinical  Atherosclerotic Cardiovascular Disease (ASCVD): No  The ASCVD Risk score (Arnett DK, et al., 2019) failed to calculate for the following reasons:   The valid total cholesterol range is 130 to 320 mg/dL   Patient is participating in a Managed Medicaid Plan: No  A/P: Diabetes longstanding now controlled, based on A1c today of 7.0%. Patient is able to verbalize appropriate hypoglycemia management plan. Medication adherence appears appropriate. As pt is tolerating Ozempic well and A1c is significantly improved, will continue current regimen. -Continued Ozempic (semaglutide) 2 mg weekly. -Patient educated on purpose, proper use, and potential adverse effects of Ozempic.  -Extensively discussed pathophysiology of diabetes, recommended lifestyle interventions, dietary effects on blood sugar control.  -Counseled on s/sx of and management of hypoglycemia.  -Next A1c anticipated 03/28/2023.   Hypertension longstanding currently well controlled based on BP of 121/73 today. Blood pressure goal of <130/80 mmHg. Medication adherence appropriate. -Continued lisinopril 10 mg daily.  Written patient instructions provided. Patient verbalized understanding of treatment plan.  Total time in face to face counseling 30 minutes.    Follow-up: 6 weeks with PCP Pharmacist PRN   Adam Phenix, PharmD PGY1 Ambulatory Care Pharmacy Resident  Butch Penny, PharmD, BCACP, CPP Clinical Pharmacist [facility] [phone]

## 2022-12-26 ENCOUNTER — Ambulatory Visit: Payer: Managed Care, Other (non HMO) | Attending: Family Medicine | Admitting: Pharmacist

## 2022-12-26 VITALS — BP 121/73 | HR 90

## 2022-12-26 DIAGNOSIS — Z7985 Long-term (current) use of injectable non-insulin antidiabetic drugs: Secondary | ICD-10-CM | POA: Diagnosis not present

## 2022-12-26 DIAGNOSIS — E1165 Type 2 diabetes mellitus with hyperglycemia: Secondary | ICD-10-CM

## 2022-12-26 LAB — POCT GLYCOSYLATED HEMOGLOBIN (HGB A1C): HbA1c, POC (controlled diabetic range): 7 % (ref 0.0–7.0)

## 2022-12-26 MED ORDER — OZEMPIC (2 MG/DOSE) 8 MG/3ML ~~LOC~~ SOPN: 2.0000 mg | PEN_INJECTOR | SUBCUTANEOUS | 1 refills | Status: DC

## 2023-01-04 ENCOUNTER — Other Ambulatory Visit: Payer: Self-pay | Admitting: Family Medicine

## 2023-02-06 ENCOUNTER — Encounter: Payer: Self-pay | Admitting: Family Medicine

## 2023-02-06 ENCOUNTER — Ambulatory Visit: Payer: Managed Care, Other (non HMO) | Admitting: Family Medicine

## 2023-02-06 VITALS — BP 139/90 | HR 95 | Temp 98.4°F | Resp 16 | Ht 69.0 in | Wt 188.8 lb

## 2023-02-06 DIAGNOSIS — K219 Gastro-esophageal reflux disease without esophagitis: Secondary | ICD-10-CM | POA: Diagnosis not present

## 2023-02-06 DIAGNOSIS — Z7985 Long-term (current) use of injectable non-insulin antidiabetic drugs: Secondary | ICD-10-CM | POA: Diagnosis not present

## 2023-02-06 DIAGNOSIS — I10 Essential (primary) hypertension: Secondary | ICD-10-CM

## 2023-02-06 DIAGNOSIS — E1169 Type 2 diabetes mellitus with other specified complication: Secondary | ICD-10-CM | POA: Diagnosis not present

## 2023-02-06 MED ORDER — OZEMPIC (2 MG/DOSE) 8 MG/3ML ~~LOC~~ SOPN: 2.0000 mg | PEN_INJECTOR | SUBCUTANEOUS | 1 refills | Status: DC

## 2023-02-06 NOTE — Progress Notes (Unsigned)
Follow up.

## 2023-02-07 ENCOUNTER — Encounter: Payer: Self-pay | Admitting: Family Medicine
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
## 2023-05-08 ENCOUNTER — Ambulatory Visit: Payer: Managed Care, Other (non HMO) | Admitting: Family Medicine

## 2023-05-08 ENCOUNTER — Encounter: Payer: Self-pay | Admitting: Family Medicine

## 2023-05-08 VITALS — BP 125/84 | HR 97 | Temp 97.7°F | Resp 16 | Ht 69.0 in | Wt 189.8 lb

## 2023-05-08 DIAGNOSIS — K219 Gastro-esophageal reflux disease without esophagitis: Secondary | ICD-10-CM | POA: Diagnosis not present

## 2023-05-08 DIAGNOSIS — Z7985 Long-term (current) use of injectable non-insulin antidiabetic drugs: Secondary | ICD-10-CM

## 2023-05-08 DIAGNOSIS — I10 Essential (primary) hypertension: Secondary | ICD-10-CM

## 2023-05-08 DIAGNOSIS — E1169 Type 2 diabetes mellitus with other specified complication: Secondary | ICD-10-CM

## 2023-05-08 LAB — POCT GLYCOSYLATED HEMOGLOBIN (HGB A1C): Hemoglobin A1C: 6.5 % — AB (ref 4.0–5.6)

## 2023-05-08 MED ORDER — LISINOPRIL 10 MG PO TABS: 10.0000 mg | ORAL_TABLET | Freq: Every day | ORAL | 1 refills | Status: DC
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
## 2023-05-09 ENCOUNTER — Encounter: Payer: Self-pay | Admitting: Family Medicine

## 2023-08-05 ENCOUNTER — Emergency Department
Admission: EM | Admit: 2023-08-05 | Discharge: 2023-08-05 | Disposition: A | Discharge status: Home / Self Care | Attending: Emergency Medicine | Admitting: Emergency Medicine

## 2023-08-05 DIAGNOSIS — R079 Chest pain, unspecified: Secondary | ICD-10-CM | POA: Diagnosis present

## 2023-08-05 DIAGNOSIS — G8918 Other acute postprocedural pain: Secondary | ICD-10-CM | POA: Diagnosis not present

## 2023-09-08 ENCOUNTER — Other Ambulatory Visit: Payer: Self-pay

## 2023-09-08 ENCOUNTER — Telehealth: Payer: Self-pay

## 2023-09-08 NOTE — Telephone Encounter (Signed)
 Pharmacy Patient Advocate Encounter  Received notification from [employer] that Prior Authorization for [facility] has been APPROVED from 09/08/2023 to 09/07/2024   PA #/Case ID/Reference #: [id number]

## 2023-11-13 ENCOUNTER — Ambulatory Visit (INDEPENDENT_AMBULATORY_CARE_PROVIDER_SITE_OTHER): Payer: Managed Care, Other (non HMO) | Admitting: Family Medicine

## 2023-11-13 ENCOUNTER — Encounter: Payer: Self-pay | Admitting: Family Medicine

## 2023-11-13 VITALS — BP 133/79 | HR 91 | Ht 69.0 in | Wt 189.6 lb

## 2023-11-13 DIAGNOSIS — Z7985 Long-term (current) use of injectable non-insulin antidiabetic drugs: Secondary | ICD-10-CM | POA: Diagnosis not present

## 2023-11-13 DIAGNOSIS — E1169 Type 2 diabetes mellitus with other specified complication: Secondary | ICD-10-CM

## 2023-11-13 DIAGNOSIS — K219 Gastro-esophageal reflux disease without esophagitis: Secondary | ICD-10-CM

## 2023-11-13 DIAGNOSIS — I10 Essential (primary) hypertension: Secondary | ICD-10-CM | POA: Diagnosis not present

## 2023-11-13 LAB — POCT GLYCOSYLATED HEMOGLOBIN (HGB A1C): HbA1c, POC (controlled diabetic range): 6.1 % (ref 0.0–7.0)

## 2023-11-13 MED ORDER — OZEMPIC (2 MG/DOSE) 8 MG/3ML ~~LOC~~ SOPN: 2.0000 mg | PEN_INJECTOR | SUBCUTANEOUS | 1 refills | Status: DC

## 2023-11-13 MED ORDER — LISINOPRIL 10 MG PO TABS: 10.0000 mg | ORAL_TABLET | Freq: Every day | ORAL | 1 refills | Status: AC
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
## 2023-11-15 ENCOUNTER — Encounter: Payer: Self-pay | Admitting: Family Medicine

## 2024-05-13 ENCOUNTER — Encounter: Payer: Self-pay | Admitting: Family Medicine

## 2024-05-13 ENCOUNTER — Ambulatory Visit: Admitting: Family Medicine

## 2024-05-13 VITALS — BP 127/75 | HR 89 | Ht 69.0 in | Wt 191.0 lb

## 2024-05-13 DIAGNOSIS — Z0000 Encounter for general adult medical examination without abnormal findings: Secondary | ICD-10-CM

## 2024-05-13 DIAGNOSIS — E119 Type 2 diabetes mellitus without complications: Secondary | ICD-10-CM | POA: Diagnosis not present

## 2024-05-13 DIAGNOSIS — E1169 Type 2 diabetes mellitus with other specified complication: Secondary | ICD-10-CM

## 2024-05-13 DIAGNOSIS — Z136 Encounter for screening for cardiovascular disorders: Secondary | ICD-10-CM

## 2024-05-13 DIAGNOSIS — Z130 Encounter for screening for diseases of the blood and blood-forming organs and certain disorders involving the immune mechanism: Secondary | ICD-10-CM

## 2024-05-14 LAB — MICROALBUMIN / CREATININE URINE RATIO
Creatinine, Urine: 169.4 mg/dL
Microalb/Creat Ratio: 6 mg/g{creat} (ref 0–29)
Microalbumin, Urine: 10.2 ug/mL

## 2024-05-14 LAB — CBC WITH DIFFERENTIAL/PLATELET
Basophils Absolute: 0 x10E3/uL (ref 0.0–0.2)
Basos: 1 %
EOS (ABSOLUTE): 0.2 x10E3/uL (ref 0.0–0.4)
Eos: 2 %
Hematocrit: 51.5 % — ABNORMAL HIGH (ref 37.5–51.0)
Hemoglobin: 17.2 g/dL (ref 13.0–17.7)
Immature Grans (Abs): 0 x10E3/uL (ref 0.0–0.1)
Immature Granulocytes: 0 %
Lymphocytes Absolute: 2.6 x10E3/uL (ref 0.7–3.1)
Lymphs: 30 %
MCH: 29.4 pg (ref 26.6–33.0)
MCHC: 33.4 g/dL (ref 31.5–35.7)
MCV: 88 fL (ref 79–97)
Monocytes Absolute: 1 x10E3/uL — ABNORMAL HIGH (ref 0.1–0.9)
Monocytes: 12 %
Neutrophils Absolute: 4.7 x10E3/uL (ref 1.4–7.0)
Neutrophils: 55 %
Platelets: 229 x10E3/uL (ref 150–450)
RBC: 5.85 x10E6/uL — ABNORMAL HIGH (ref 4.14–5.80)
RDW: 12.2 % (ref 11.6–15.4)
WBC: 8.5 x10E3/uL (ref 3.4–10.8)

## 2024-05-14 LAB — COMPREHENSIVE METABOLIC PANEL WITH GFR
ALT: 31 IU/L (ref 0–44)
AST: 25 IU/L (ref 0–40)
Albumin: 4.5 g/dL (ref 3.8–4.9)
Alkaline Phosphatase: 88 IU/L (ref 47–123)
BUN/Creatinine Ratio: 21 — ABNORMAL HIGH (ref 9–20)
BUN: 18 mg/dL (ref 6–24)
Bilirubin Total: 0.4 mg/dL (ref 0.0–1.2)
CO2: 24 mmol/L (ref 20–29)
Calcium: 9.5 mg/dL (ref 8.7–10.2)
Chloride: 100 mmol/L (ref 96–106)
Creatinine, Ser: 0.84 mg/dL (ref 0.76–1.27)
Globulin, Total: 2.5 g/dL (ref 1.5–4.5)
Glucose: 118 mg/dL — ABNORMAL HIGH (ref 70–99)
Potassium: 4.5 mmol/L (ref 3.5–5.2)
Sodium: 139 mmol/L (ref 134–144)
Total Protein: 7 g/dL (ref 6.0–8.5)
eGFR: 100 mL/min/1.73 (ref >59)

## 2024-05-14 LAB — LIPID PANEL
Chol/HDL Ratio: 3.1 ratio (ref 0.0–5.0)
Cholesterol, Total: 100 mg/dL (ref 100–199)
HDL: 32 mg/dL — ABNORMAL LOW (ref >39)
LDL Chol Calc (NIH): 40 mg/dL (ref 0–99)
Triglycerides: 164 mg/dL — ABNORMAL HIGH (ref 0–149)
VLDL Cholesterol Cal: 28 mg/dL (ref 5–40)

## 2024-05-14 LAB — HEMOGLOBIN A1C
Est. average glucose Bld gHb Est-mCnc: 137 mg/dL
Hgb A1c MFr Bld: 6.4 % — ABNORMAL HIGH (ref 4.8–5.6)

## 2024-05-15 ENCOUNTER — Ambulatory Visit: Payer: Self-pay | Admitting: Family Medicine

## 2024-05-15 ENCOUNTER — Encounter: Payer: Self-pay | Admitting: Family Medicine
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
## 2024-05-16 ENCOUNTER — Other Ambulatory Visit: Payer: Self-pay | Admitting: Family Medicine
